# Patient Record
Sex: Male | Born: 1945 | Race: White | Hispanic: No | Marital: Single | Smoking: Former smoker
Health system: Southern US, Community
[De-identification: ages and names within clinical notes are randomized; demographics above are authoritative.]

## PROBLEM LIST (undated history)

## (undated) DIAGNOSIS — I714 Abdominal aortic aneurysm, without rupture, unspecified: Secondary | ICD-10-CM

## (undated) DIAGNOSIS — I2510 Atherosclerotic heart disease of native coronary artery without angina pectoris: Secondary | ICD-10-CM

## (undated) DIAGNOSIS — I739 Peripheral vascular disease, unspecified: Secondary | ICD-10-CM

## (undated) DIAGNOSIS — I779 Disorder of arteries and arterioles, unspecified: Secondary | ICD-10-CM

## (undated) DIAGNOSIS — I10 Essential (primary) hypertension: Secondary | ICD-10-CM

## (undated) HISTORY — DX: Disorder of arteries and arterioles, unspecified: I77.9

## (undated) HISTORY — PX: OTHER SURGICAL HISTORY: SHX169

## (undated) HISTORY — DX: Peripheral vascular disease, unspecified: I73.9

## (undated) HISTORY — PX: CAROTID ENDARTERECTOMY: SUR193

## (undated) HISTORY — PX: CORONARY ANGIOPLASTY: SHX604

---

## 1999-10-26 ENCOUNTER — Ambulatory Visit: Admission: RE | Admit: 1999-10-26 | Discharge: 1999-10-26 | Payer: Self-pay | Admitting: *Deleted

## 1999-11-21 ENCOUNTER — Inpatient Hospital Stay: Admission: RE | Admit: 1999-11-21 | Discharge: 1999-11-23 | Payer: Self-pay | Admitting: *Deleted

## 1999-11-22 ENCOUNTER — Encounter: Payer: Self-pay | Admitting: *Deleted

## 2000-05-31 ENCOUNTER — Ambulatory Visit (HOSPITAL_COMMUNITY): Admission: RE | Admit: 2000-05-31 | Discharge: 2000-05-31 | Payer: Self-pay | Admitting: *Deleted

## 2000-06-03 ENCOUNTER — Ambulatory Visit (HOSPITAL_COMMUNITY): Admission: RE | Admit: 2000-06-03 | Discharge: 2000-06-03 | Payer: Self-pay | Admitting: *Deleted

## 2000-06-03 ENCOUNTER — Encounter: Payer: Self-pay | Admitting: *Deleted

## 2000-12-24 ENCOUNTER — Encounter: Payer: Self-pay | Admitting: *Deleted

## 2000-12-26 ENCOUNTER — Inpatient Hospital Stay (HOSPITAL_COMMUNITY): Admission: RE | Admit: 2000-12-26 | Discharge: 2000-12-28 | Payer: Self-pay | Admitting: *Deleted

## 2007-07-29 ENCOUNTER — Ambulatory Visit: Payer: Self-pay | Admitting: *Deleted

## 2011-03-09 NOTE — Op Note (Signed)
. [facility]  Patient:    Nathan Mcgee, Nathan Mcgee                        MRN: [id number] Proc. Date: 12/26/00 Adm. Date:  [id number] Disc. Date: [id number] Attending:  Melvenia Needles CC:         Darden Palmer., M.D.  Dr. Beckie Busing, [place]   Operative Report  PREOPERATIVE DIAGNOSIS:  Severe right internal carotid artery stenosis.  POSTOPERATIVE DIAGNOSIS:  Severe right internal carotid artery stenosis.  OPERATION PERFORMED:  Right carotid endarterectomy.  SURGEON:  Denman George, M.D.  ASSISTANT: 1. Di Kindle. Edilia Bo, M.D. 2. Tollie Pizza. Thomasena Edis, P.A.  ANESTHESIA:  General endotracheal.  ANESTHESIOLOGIST:  Dr. Krista Blue.  INDICATIONS FOR PROCEDURE:  Mr. Kamel is a 65 year old male with known cerebrovascular occlusive disease.  He has a left internal carotid artery occlusion.  He is known to have right coronary artery stenosis.  He has been followed in the office on a regular basis with serial duplex evaluation. Recent duplexes reveal further progression of right internal carotid artery stenosis.  Taking into account his contralateral occlusion, it was recommended that the patient undergo right carotid endarterectomy for reduction of stroke risk.  He consented for surgery.  Risks and benefits of the operative procedure were explained in detail including potential risk of MI, CVA, and death.  Major morbidity and mortality approximately 2%.  The patient consented for surgery.  DESCRIPTION OF PROCEDURE:  Patient placed in supine position.   General endotracheal anesthesia induced.  Foley catheter and arterial line inserted.  The right neck was prepped and draped in a sterile fashion.  A curvilinear skin incision was made along the anterior border of the right sternomastoid muscle.  The incision extended deeply through the subcutaneous tissues. Dissection carried down to the platysma with electrocautery.  Deep dissection carried  along the anterior border of the sternomastoid to the carotid sheath. The common carotid artery mobilized proximally and encircled with a vessel loop.  Distal dissection carried up to the carotid bifurcation where the superior thyroid and external carotid artery were encircled with fine vessel loops.  The internal carotid artery followed distally up to the posterior belly of the digastric muscle.  The distal internal carotid artery was free of significant disease and encircled with a vessel loop.  The hypoglossal nerve clearly identified and reflected superiorly and preserved.  The carotid bifurcation revealed calcified plaque extending into the internal carotid artery.  The patient was administered 7000 units of heparin intravenously.  Adequate circulation time permitted.  The carotid vessels controlled with clamps.  A longitudinal arteriotomy made in the distal common carotid artery.  The arteriotomy extended across the carotid bulb and up into the internal carotid artery.  There was approximately an 80% right internal carotid artery stenosis present.  A shunt was inserted.  The plaque was removed with an endarterectomy elevator.  In the common carotid artery the plaque was divided transversely with Potts scissors.  The superior thyroid and external carotid were endarterectomized using an eversion technique.  Under direct vision the internal carotid artery plaque was feathered out without distal flap.  Fragments of plaque were removed with plaque forceps.  The site irrigated with heparin saline solution.  A Hemashield Finesse patch was then placed over the endartectomy site with running 6-0 Prolene suture.  The shunt removed.  All vessels well-flushed. Clamps were removed directing initial antegrade flow up the external carotid  artery, following this, the internal carotid artery was released.  There was an excellent pulse and Doppler signal in the distal internal carotid artery.  The  patient administered 30 mg protamine intravenously.  Adequate hemostasis obtained.  Sponge and instrument counts were correct.  Sternomastoid fascia closed with running 2-0 Vicryl suture.  Platysma closed with running 3-0 Vicryl suture.  The skin was closed with 4-0 Monocryl.  Halkf inch Steri-Strips applied.  The patient tolerated the procedure well. Transferred to recovery room in stable neurologic condition. DD:  01/15/01 TD:  01/16/01 Job: [id number]

## 2011-03-09 NOTE — Discharge Summary (Signed)
[place]. [facility]  Patient:    Nathan Mcgee, Nathan Mcgee                          MRN: [id number] Adm. Date:  12/26/00 Attending:  Denman George, M.D. Dictator:   Marlowe Kays, P.A. CC:         Dr. Orlan Leavens., M.D.   Discharge Summary  DATE OF BIRTH:  Oct 11, 1946  CHIEF COMPLAINT:  Coronary artery disease.  HISTORY OF PRESENT ILLNESS:  Nathan Mcgee is a pleasant 65 year old white male with known history of coronary artery disease.  This condition has been followed up with ultrasound.  The patient underwent an arteriogram in August 2001 revealing right ICA stenosis with about 60% range, left ICA occlusion, and right vertebral artery occlusion, asymptomatic.  Ultrasound on December 09, 2000, however, revealed continuous progression of the right ICA stenosis, now in the severe range with velocities over 500 cm per second.  The patient remains asymptomatic.  Because there is concern of a stroke at this young age, Dr. Madilyn Fireman recommended proceeding with right carotid endarterectomy scheduled on December 26, 2000.  Other than occasional headaches which are chronic, the patient denies any nausea, vomiting, vertigo, dizziness, falls, seizures, numbness, tingling, muscle weakness, speech impairment, dysphagia other than his occasional reflux symptoms.  No vision changes, syncope, presyncope, memory loss, or confusion.  PAST MEDICAL HISTORY:  1. Coronary artery disease.  2. Anxiety.  3. Hypertension.  4. Hypercholesterolemia.  5. GERD symptoms.  6. Renal insufficiency.  7. History of tobacco abuse, recently quit.  8. History of nephro3lithiasis.  9. History of migraine headaches. 10. Peripheral vascular occlusive disease, claudication. 11. Status post left ankle fracture in [place] during the war.  PAST SURGICAL HISTORY:  1. Status post cerebral angiogram in 2001, Dr. Madilyn Fireman.  2. Status post left external iliac artery PTA and stent on     November 22, 1999, Dr. Madilyn Fireman.  3. Status post lithotripsy in 1998.  4. Status post renal angioplasty in 1972 and 1978.  MEDICATIONS:  1. Elavil 25 mg p.o. q.d.  2. Atenolol 50 mg b.i.d.  3. Zocor 20 mg q.d.  4. Triamterene 75 mg 1/2 p.o. q.d.  5. Aciphex 20 mg q.d.  6. Niacin 500 mg b.i.d.  ALLERGIES:  No known drug allergies.  REVIEW OF SYSTEMS:  See HPI and Past Medical History for significant positives.   No diabetes, asthma, or COPD.  He does have seasonal allergies.  FAMILY HISTORY:  Mother died at 9 of breast cancer.  Father alive with history of diabetes.  SOCIAL HISTORY:  Married, one grown child.  He is an Public affairs consultant.  He is a [place] veteran.  He quit in November 2000 the use of cigarettes.  He denies any alcohol intake.  PHYSICAL EXAMINATION:  GENERAL:  A 65 year old white male in no acute distress, alert and oriented x 3.  VITAL SIGNS:  Blood pressure 100/62, pulse 60, respirations 18.  HEENT:  Normocephalic, atraumatic.  PERRLA. EOMI.  Funduscopic exam within normal limits .  NECK:  Supple.  No JVD.  Bilateral bruits more pronounced on the right.  No lymphadenopathy.  CHEST:  Symmetrical on inspiration.  Lungs show anterior wheezing.  Distant lung sounds, no rhonchi or rales.  No lymphadenopathy.  CARDIOVASCULAR:  Regular rate and rhythm, 2/6 systolic murmur.  No rubs or gallops.  ABDOMEN:  Soft, nontender.  Bowel sounds present x 4.  No hepatosplenomegaly or bruits.  GU/RECTAL:  Deferred.  EXTREMITIES:  No clubbing, cyanosis, or edema.  SKIN:  No ulcerations.  Decreased temperature in the feet; otherwise, the rest of the temperature of his skin is warm.  PERIPHERAL PULSES:  Carotid 2+ bilaterally.  Femoral 2+ bilaterally with bilateral bruits.  Popliteal 1+ on the right, nonpalpable on the left. Dorsalis pedis and posterior tibialis 1+ on the right, nonpalpable on the left.  NEUROLOGIC:  Nonfocal.  Gait steady.  DTRs 2+ bilaterally.  Muscle  strength 5/5.  ASSESSMENT/PLAN:  Right internal carotid artery stenosis for right carotid endarterectomy on December 26, 2000, by Dr. Madilyn Fireman.  Dr. Madilyn Fireman has seen and evaluated this patient prior to admission and has explained the risks and benefits involving the procedure, and the patient has agreed to continue. DD:  12/24/00 TD:  12/24/00 Job: [id number]

## 2011-03-09 NOTE — Discharge Summary (Signed)
[place]  Patient:    Nathan Mcgee, PINSON                        MRN: [id number] Adm. Date:  [id number] Disc. Date: [id number] Attending:  Melvenia Needles Dictator:   Tollie Pizza. Thomasena Edis, P.A. CC:         Dr. Beckie Busing, [place], [place]   Discharge Summary  DISCHARGE DIAGNOSES: 1. Progressive asymptomatic right internal carotid artery stenosis. 2. Occluded left internal carotid artery. 3. Hypertension. 4. Hypercholesterolemia. 5. Gastroesophageal reflux disease. 6. Peripheral vascular disease. 7. Anxiety disorder.  PROCEDURE:  Right carotid endarterectomy with Dacron patch angioplasty.  HISTORY OF PRESENT ILLNESS:  The patient is a 65 year old, white male with a known history of carotid artery disease.  On recent ultrasound, he was noted to have significant progression of the stenosis of his right internal carotid into the severe range.  His left internal carotids are occluded.  It was felt that in light of this progression and his other coexisting medical problems, he would benefit from surgical intervention at this time.  HOSPITAL COURSE:  He was admitted on December 26, 2000, and taken to the operating room where he underwent right carotid endarterectomy with Dacron patch angioplasty.  He tolerated the procedure well and was transferred from the PACU to the floor in stable condition.  He did become hypotensive and had difficulty maintaining blood pressures above 100 systolic despite adequate volume status.  He was placed on dopamine drip which was later to be weaned when his blood pressure stabilized.  At this time, he is maintaining blood pressures in the 130-160 systolic range.  He is off all drips at this time. He has been ambulating in the halls without difficulty and is weaned off of supplemental oxygen.  He is tolerating a regular diet with normal bowel and bladder function.  His neck incision is healing well.  Neurologically, he is grossly  intact except for slight tongue deviation to the right which is felt to be related to hypoglossal dysfunction.  This is stable and has not worsened.  It is felt that he can be discharged home at this time.  SPECIAL INSTRUCTIONS:  He is to shower daily and clean his incisions with soap and water.  Our office is to be notified if his incisions reddened, develop drainage, swelling or if he experiences any fever greater than 101 or new-onset neurological symptoms.  ACTIVITY;  He is to refrain from driving, strenuous exercise or lifting greater than 10 pounds, but is to continue daily walking and deep breathing exercises.  DISCHARGE MEDICATIONS: 1. Continue home medications. 2. Prescription given for Tylox one to two q.4h. p.r.n. pain.  FOLLOWUP:  He was to see Dr. Madilyn Fireman on January 13, 2001, at 8:40 a.m. for followup.  We will schedule an appointment with his primary care physician, Dr. Lucretia Roers, in two to three weeks. DD:  12/28/00 TD:  12/30/00 Job: [id number]

## 2012-01-14 ENCOUNTER — Emergency Department (HOSPITAL_COMMUNITY): Payer: Medicare Other

## 2012-01-14 ENCOUNTER — Other Ambulatory Visit: Payer: Self-pay

## 2012-01-14 ENCOUNTER — Inpatient Hospital Stay (HOSPITAL_COMMUNITY)
Admission: EM | Admit: 2012-01-14 | Discharge: 2012-01-27 | DRG: 246 | Disposition: A | Discharge status: Home / Self Care | Payer: Medicare Other | Attending: Cardiovascular Disease | Admitting: Cardiovascular Disease

## 2012-01-14 ENCOUNTER — Encounter (HOSPITAL_COMMUNITY): Payer: Self-pay

## 2012-01-14 DIAGNOSIS — F411 Generalized anxiety disorder: Secondary | ICD-10-CM | POA: Diagnosis present

## 2012-01-14 DIAGNOSIS — N289 Disorder of kidney and ureter, unspecified: Secondary | ICD-10-CM | POA: Diagnosis present

## 2012-01-14 DIAGNOSIS — I469 Cardiac arrest, cause unspecified: Secondary | ICD-10-CM | POA: Diagnosis not present

## 2012-01-14 DIAGNOSIS — J9601 Acute respiratory failure with hypoxia: Secondary | ICD-10-CM

## 2012-01-14 DIAGNOSIS — R570 Cardiogenic shock: Secondary | ICD-10-CM | POA: Diagnosis present

## 2012-01-14 DIAGNOSIS — R079 Chest pain, unspecified: Secondary | ICD-10-CM

## 2012-01-14 DIAGNOSIS — I2109 ST elevation (STEMI) myocardial infarction involving other coronary artery of anterior wall: Principal | ICD-10-CM | POA: Diagnosis present

## 2012-01-14 DIAGNOSIS — R001 Bradycardia, unspecified: Secondary | ICD-10-CM | POA: Diagnosis not present

## 2012-01-14 DIAGNOSIS — J9600 Acute respiratory failure, unspecified whether with hypoxia or hypercapnia: Secondary | ICD-10-CM | POA: Diagnosis present

## 2012-01-14 DIAGNOSIS — G4700 Insomnia, unspecified: Secondary | ICD-10-CM | POA: Diagnosis present

## 2012-01-14 DIAGNOSIS — D72829 Elevated white blood cell count, unspecified: Secondary | ICD-10-CM | POA: Diagnosis present

## 2012-01-14 DIAGNOSIS — I10 Essential (primary) hypertension: Secondary | ICD-10-CM | POA: Diagnosis present

## 2012-01-14 DIAGNOSIS — IMO0002 Reserved for concepts with insufficient information to code with codable children: Secondary | ICD-10-CM | POA: Diagnosis present

## 2012-01-14 DIAGNOSIS — T82897A Other specified complication of cardiac prosthetic devices, implants and grafts, initial encounter: Secondary | ICD-10-CM | POA: Diagnosis not present

## 2012-01-14 DIAGNOSIS — Y849 Medical procedure, unspecified as the cause of abnormal reaction of the patient, or of later complication, without mention of misadventure at the time of the procedure: Secondary | ICD-10-CM | POA: Diagnosis not present

## 2012-01-14 DIAGNOSIS — I219 Acute myocardial infarction, unspecified: Secondary | ICD-10-CM

## 2012-01-14 DIAGNOSIS — J810 Acute pulmonary edema: Secondary | ICD-10-CM | POA: Diagnosis present

## 2012-01-14 DIAGNOSIS — E876 Hypokalemia: Secondary | ICD-10-CM | POA: Diagnosis not present

## 2012-01-14 DIAGNOSIS — T380X5A Adverse effect of glucocorticoids and synthetic analogues, initial encounter: Secondary | ICD-10-CM | POA: Diagnosis present

## 2012-01-14 DIAGNOSIS — I498 Other specified cardiac arrhythmias: Secondary | ICD-10-CM | POA: Diagnosis present

## 2012-01-14 DIAGNOSIS — F17200 Nicotine dependence, unspecified, uncomplicated: Secondary | ICD-10-CM | POA: Diagnosis present

## 2012-01-14 DIAGNOSIS — R51 Headache: Secondary | ICD-10-CM | POA: Diagnosis present

## 2012-01-14 HISTORY — DX: Essential (primary) hypertension: I10

## 2012-01-14 HISTORY — DX: Abdominal aortic aneurysm, without rupture, unspecified: I71.40

## 2012-01-14 HISTORY — DX: Abdominal aortic aneurysm, without rupture: I71.4

## 2012-01-14 HISTORY — DX: Atherosclerotic heart disease of native coronary artery without angina pectoris: I25.10

## 2012-01-14 LAB — PROTIME-INR
INR: 0.97 (ref 0.00–1.49)
Prothrombin Time: 13.1 seconds (ref 11.6–15.2)

## 2012-01-14 LAB — BASIC METABOLIC PANEL
GFR calc Af Amer: 62 mL/min — ABNORMAL LOW (ref >90)
GFR calc non Af Amer: 54 mL/min — ABNORMAL LOW (ref >90)
Glucose, Bld: 118 mg/dL — ABNORMAL HIGH (ref 70–99)
Potassium: 4.1 mEq/L (ref 3.5–5.1)
Sodium: 136 mEq/L (ref 135–145)

## 2012-01-14 LAB — CBC
MCH: 30.7 pg (ref 26.0–34.0)
Platelets: 242 10*3/uL (ref 150–400)
RBC: 4.72 MIL/uL (ref 4.22–5.81)
WBC: 10.6 10*3/uL — ABNORMAL HIGH (ref 4.0–10.5)

## 2012-01-14 LAB — DIFFERENTIAL
Eosinophils Absolute: 0.4 10*3/uL (ref 0.0–0.7)
Lymphs Abs: 1.5 10*3/uL (ref 0.7–4.0)
Neutrophils Relative %: 77 % (ref 43–77)

## 2012-01-14 LAB — CARDIAC PANEL(CRET KIN+CKTOT+MB+TROPI)
Relative Index: 2.8 — ABNORMAL HIGH (ref 0.0–2.5)
Troponin I: <0.3 ng/mL (ref <0.30)

## 2012-01-14 MED ORDER — SODIUM CHLORIDE 0.9 % IJ SOLN: 3.0000 mL | INTRAMUSCULAR | Status: DC | PRN

## 2012-01-14 MED ORDER — SODIUM CHLORIDE 0.9 % IV BOLUS (SEPSIS)
1000.0000 mL | Freq: Once | INTRAVENOUS | Status: AC
  Administered 2012-01-14: 1000 mL via INTRAVENOUS

## 2012-01-14 MED ORDER — MUPIROCIN 2 % EX OINT
1.0000 "application " | TOPICAL_OINTMENT | Freq: Two times a day (BID) | CUTANEOUS | Status: AC
  Administered 2012-01-15 – 2012-01-19 (×10): 1 via NASAL
  Filled 2012-01-14 (×2): qty 22

## 2012-01-14 MED ORDER — HEPARIN (PORCINE) IN NACL 100-0.45 UNIT/ML-% IJ SOLN
1400.0000 [IU]/h | INTRAMUSCULAR | Status: DC
  Administered 2012-01-14: 1100 [IU]/h via INTRAVENOUS
  Filled 2012-01-14 (×3): qty 250

## 2012-01-14 MED ORDER — MORPHINE SULFATE 2 MG/ML IJ SOLN
2.0000 mg | INTRAMUSCULAR | Status: DC | PRN
  Administered 2012-01-14 – 2012-01-15 (×2): 2 mg via INTRAVENOUS
  Filled 2012-01-14 (×3): qty 1

## 2012-01-14 MED ORDER — AMITRIPTYLINE HCL 25 MG PO TABS
25.0000 mg | ORAL_TABLET | Freq: Every day | ORAL | Status: DC
  Administered 2012-01-14 – 2012-01-17 (×4): 25 mg via ORAL
  Filled 2012-01-14 (×6): qty 1

## 2012-01-14 MED ORDER — BENAZEPRIL HCL 10 MG PO TABS
10.0000 mg | ORAL_TABLET | Freq: Every day | ORAL | Status: DC
  Filled 2012-01-14: qty 1

## 2012-01-14 MED ORDER — SODIUM CHLORIDE 0.9 % IV SOLN: 250.0000 mL | INTRAVENOUS | Status: DC | PRN

## 2012-01-14 MED ORDER — SODIUM CHLORIDE 0.9 % IV BOLUS (SEPSIS): 1000.0000 mL | Freq: Once | INTRAVENOUS | Status: DC

## 2012-01-14 MED ORDER — MORPHINE SULFATE 2 MG/ML IJ SOLN: 2.0000 mg | Freq: Once | INTRAMUSCULAR | Status: DC

## 2012-01-14 MED ORDER — NITROGLYCERIN 0.4 MG SL SUBL
0.4000 mg | SUBLINGUAL_TABLET | SUBLINGUAL | Status: DC | PRN
  Administered 2012-01-14: 0.4 mg via SUBLINGUAL
  Filled 2012-01-14: qty 25

## 2012-01-14 MED ORDER — ASPIRIN EC 325 MG PO TBEC
325.0000 mg | DELAYED_RELEASE_TABLET | Freq: Every day | ORAL | Status: DC
  Filled 2012-01-14: qty 1

## 2012-01-14 MED ORDER — PAROXETINE HCL 20 MG PO TABS
20.0000 mg | ORAL_TABLET | Freq: Every day | ORAL | Status: DC
  Administered 2012-01-14 – 2012-01-17 (×4): 20 mg via ORAL
  Filled 2012-01-14 (×5): qty 1

## 2012-01-14 MED ORDER — SODIUM CHLORIDE 0.9 % IJ SOLN: 3.0000 mL | Freq: Two times a day (BID) | INTRAMUSCULAR | Status: DC

## 2012-01-14 MED ORDER — ATENOLOL 25 MG PO TABS
25.0000 mg | ORAL_TABLET | Freq: Every day | ORAL | Status: DC
Start: 2012-01-14 — End: 2012-01-14
  Filled 2012-01-14: qty 1

## 2012-01-14 MED ORDER — ASPIRIN EC 325 MG PO TBEC: 325.0000 mg | DELAYED_RELEASE_TABLET | Freq: Every day | ORAL | Status: DC

## 2012-01-14 MED ORDER — ATROPINE SULFATE 0.1 MG/ML IJ SOLN
INTRAMUSCULAR | Status: AC
  Administered 2012-01-14: 18:00:00
  Filled 2012-01-14: qty 10

## 2012-01-14 MED ORDER — SIMVASTATIN 40 MG PO TABS
40.0000 mg | ORAL_TABLET | Freq: Every day | ORAL | Status: DC
  Filled 2012-01-14 (×2): qty 1

## 2012-01-14 MED ORDER — HEPARIN BOLUS VIA INFUSION
4000.0000 [IU] | Freq: Once | INTRAVENOUS | Status: AC
  Administered 2012-01-14: 4000 [IU] via INTRAVENOUS

## 2012-01-14 MED ORDER — SODIUM CHLORIDE 0.9 % IV SOLN
INTRAVENOUS | Status: DC
  Administered 2012-01-14: 75 mL/h via INTRAVENOUS
  Administered 2012-01-15: 11:00:00 via INTRAVENOUS

## 2012-01-14 MED ORDER — ATENOLOL 25 MG PO TABS
25.0000 mg | ORAL_TABLET | Freq: Every day | ORAL | Status: DC
  Administered 2012-01-15: 25 mg via ORAL
  Filled 2012-01-14: qty 1

## 2012-01-14 MED ORDER — CHLORHEXIDINE GLUCONATE CLOTH 2 % EX PADS
6.0000 | MEDICATED_PAD | Freq: Every day | CUTANEOUS | Status: DC
  Administered 2012-01-15: 6 via TOPICAL

## 2012-01-14 MED ORDER — ALUM & MAG HYDROXIDE-SIMETH 200-200-20 MG/5ML PO SUSP
30.0000 mL | Freq: Four times a day (QID) | ORAL | Status: DC | PRN
  Administered 2012-01-15: 30 mL via ORAL
  Filled 2012-01-14: qty 30

## 2012-01-14 MED ORDER — BENAZEPRIL HCL 10 MG PO TABS
10.0000 mg | ORAL_TABLET | Freq: Every day | ORAL | Status: DC
  Administered 2012-01-15: 10 mg via ORAL
  Filled 2012-01-14: qty 1

## 2012-01-14 MED ORDER — SODIUM CHLORIDE 0.9 % IJ SOLN
3.0000 mL | Freq: Two times a day (BID) | INTRAMUSCULAR | Status: DC
  Administered 2012-01-14: 3 mL via INTRAVENOUS

## 2012-01-14 MED ORDER — MORPHINE SULFATE 4 MG/ML IJ SOLN
4.0000 mg | Freq: Once | INTRAMUSCULAR | Status: DC
  Administered 2012-01-14: 2 mg via INTRAVENOUS
  Filled 2012-01-14: qty 1

## 2012-01-14 MED ORDER — LEVOTHYROXINE SODIUM 137 MCG PO TABS
137.0000 ug | ORAL_TABLET | Freq: Every day | ORAL | Status: DC
  Administered 2012-01-15 – 2012-01-17 (×3): 137 ug via ORAL
  Filled 2012-01-14 (×5): qty 1

## 2012-01-14 MED ORDER — ASPIRIN 81 MG PO CHEW
324.0000 mg | CHEWABLE_TABLET | ORAL | Status: AC
  Administered 2012-01-15: 324 mg via ORAL
  Filled 2012-01-14: qty 4

## 2012-01-14 NOTE — ED Notes (Signed)
Pt was brought in by EMS with c/o chest pressure onset 45 minutes ago with SOB. Pt took 325 mg po of ASA and was given NTG SL x2 with minimal relief.

## 2012-01-14 NOTE — ED Notes (Signed)
Pt claimed that the chest pain is better but pressure remains and pain to back and lt arm is worse after 1 tablet of NTG SL. Will let Dr. Criss Alvine know.

## 2012-01-14 NOTE — ED Notes (Signed)
Pt and family noted to be anxious. Dr. Criss Alvine made aware.

## 2012-01-14 NOTE — ED Notes (Signed)
Pt claimed that his chest pain is better, but the pressure remains and pain to the back and lt arm is worse after the NTG SL. Dr. Criss Alvine is made aware

## 2012-01-14 NOTE — H&P (Signed)
Physician History and Physical  Patient ID: Nathan Mcgee MRN: [id number] DOB/AGE: Jul 09, 1946 66 y.o. Admit date: 01/14/2012  Primary Care Physician: No primary provider on file. Primary Cardiologist: [facility]  Active Problems:  * No active hospital problems. *    HPI:  66 yo vasculopath.  History of RCEA by Dr Madilyn Fireman Subsequent stent to RICA/  Indicates left side occluded.  Also has had AAA repair with bifem.  Denies previous CAD.  This afternoon at rest watching t.v. arouund 3:30 had  SSCP.  Has chronic back pain but this was different.  EMS gave asa and 2 nitro.  Not much change.  In ER hypotensive with nitro.  Responded to saline.  No pleuritic component and initial troponin negative.  Pain improving slowily In ER better on left side.  Quck look echo shows LVH no definate RWMA and normal ascneding aorta with no disection or pericardial effusion.  CXR with cardiomegaly no CHF and normal mediastinum  Discussed with family Already on Plavix for vascular disease.  Heparin and continue beta blocker will need cath in am unless he developes acute ECG changes or positive enzymes Also indicates he cannot take fentanyl patch  Review of systems complete and found to be negative unless listed above   Past Medical History  Diagnosis Date  . Coronary artery disease   . Hypertension     No family history on file.  History   Social History  . Marital Status: Single    Spouse Name: N/A    Number of Children: N/A  . Years of Education: N/A   Occupational History  . Not on file.   Social History Main Topics  . Smoking status: Current Everyday Smoker -- 0.5 packs/day  . Smokeless tobacco: Not on file  . Alcohol Use: No  . Drug Use: No  . Sexually Active:    Other Topics Concern  . Not on file   Social History Narrative  . No narrative on file    Past Surgical History  Procedure Date  . Coronary angioplasty   . Carotid endarterectomy       (Not in a hospital  admission)  Physical Exam: Mild distress Hemodynamics good Lungs clear Right carotid bruit S1/S2 SEM  BS positive no AAA  Left femoral brut S/P aortobifem Plus 2 PT Neuro nonfocal  Labs:   Lab Results  Component Value Date   WBC 10.6* 01/14/2012   HGB 14.5 01/14/2012   HCT 41.1 01/14/2012   MCV 87.1 01/14/2012   PLT 242 01/14/2012    Lab 01/14/12 1747  NA 136  K 4.1  CL 100  CO2 26  BUN 21  CREATININE 1.34  CALCIUM 9.2  PROT --  BILITOT --  ALKPHOS --  ALT --  AST --  GLUCOSE 118*   Lab Results  Component Value Date   TROPONINI <0.30 01/14/2012       Radiology: Looked at digital image on machine when portable done.  Cardiomegaly mediastinum ok no CHF  EKG: SR LVH T inversion V2-4 and I,AVL ? Ischemia or strain from LVH no old ones for comparision  ASSESSMENT AND PLAN: Chest Pain:  Over 5 hours of pain with negative troponin.  Quick beside echo with no effusion.  Normal ascending Aorta and no obvious RWMA although apex not well seen Continue ASA/Plavix start heparin Admit to ICU Cath in am PVD:  Palpable pulses in legs.  Denies claudication.  Activity limited by back pain HTN:  Continue atenolol  and ACE hold calcium blocker Chol:  Continue statin with known PVD  Discussed plan of care with patient wife and family They are in agreement and understand.  SignedTheron Arista Nishan3/25/2013, 7:00 PM

## 2012-01-14 NOTE — Progress Notes (Signed)
ANTICOAGULATION CONSULT NOTE - Initial Consult  Pharmacy Consult for Heparin Indication: chest pain/ACS  Allergies  Allergen Reactions  . Codeine Itching    Patient Measurements: Height: 5\' 8"  (172.7 cm) Weight: 175 lb (79.379 kg) IBW/kg (Calculated) : 68.4   Vital Signs: Temp: 98 F (36.7 C) (03/25 1706) Temp src: Oral (03/25 1706) BP: 144/75 mmHg (03/25 1850) Pulse Rate: 78  (03/25 1850)  Labs:  Basename 01/14/12 1747  HGB 14.5  HCT 41.1  PLT 242  APTT --  LABPROT --  INR --  HEPARINUNFRC --  CREATININE 1.34  CKTOTAL --  CKMB --  TROPONINI <0.30   Estimated Creatinine Clearance: 52.5 ml/min (by C-G formula based on Cr of 1.34).  Medical History: Past Medical History  Diagnosis Date  . Coronary artery disease   . Hypertension     Medications:   (Not in a hospital admission)  Assessment: Chest Pain:  In a patient with known history of CAD and PVD.  To begin anticoagulation with Heparin.  No contraindications identified.  Goal of Therapy:  Heparin level 0.3-0.7 units/ml   Plan:  Heparin 4000 units IV bolus x 1 Start Heparin infusion at 1100 units/hr (~14 units/kg/hr) Check Heparin level and CBC 6 hours after starting Heparin Daily Heparin level and CBC  Estella Husk, Pharm.D., BCPS Clinical Pharmacist  Pager [phone] 01/14/2012, 7:21 PM

## 2012-01-14 NOTE — ED Notes (Signed)
Pt was received to RM 19 with c/o chest pressure onset 45 minutes ago with SOB. Pt is attached to cardiac monitor. O2 started at 2 LPM/[place]. Family is at the bedside

## 2012-01-14 NOTE — ED Provider Notes (Signed)
History     CSN: [id number]  Arrival date & time 01/14/12  1655   First MD Initiated Contact with Patient 01/14/12 1731      Chief Complaint  Patient presents with  . Chest Pain    (Consider location/radiation/quality/duration/timing/severity/associated sxs/prior treatment) Patient is a 66 y.o. male presenting with chest pain. The history is provided by the patient and the spouse.  Chest Pain The chest pain began less than 1 hour ago. Chest pain occurs constantly. The chest pain is unchanged. Associated with: nothing. At its most intense, the pain is at 8/10. The pain is currently at 4/10. The severity of the pain is moderate. The quality of the pain is described as pressure-like. Primary symptoms include shortness of breath. Pertinent negatives for primary symptoms include no fever, no cough, no abdominal pain, no nausea and no vomiting.  Pertinent negatives for associated symptoms include no diaphoresis and no numbness.     Past Medical History  Diagnosis Date  . Coronary artery disease   . Hypertension     Past Surgical History  Procedure Date  . Coronary angioplasty   . Carotid endarterectomy     No family history on file.  History  Substance Use Topics  . Smoking status: Current Everyday Smoker -- 0.5 packs/day  . Smokeless tobacco: Not on file  . Alcohol Use: No      Review of Systems  Constitutional: Negative for fever, chills and diaphoresis.  HENT: Negative for congestion and rhinorrhea.   Respiratory: Positive for shortness of breath. Negative for cough.   Cardiovascular: Positive for chest pain. Negative for leg swelling.  Gastrointestinal: Negative for nausea, vomiting, abdominal pain, constipation and blood in stool.  Genitourinary: Negative for dysuria and decreased urine volume.  Neurological: Negative for numbness and headaches.  Psychiatric/Behavioral: Negative for confusion.  All other systems reviewed and are negative.    Allergies    Codeine  Home Medications   Current Outpatient Rx  Name Route Sig Dispense Refill  . ALLOPURINOL 300 MG PO TABS Oral Take 300 mg by mouth daily.    Marland Kitchen AMITRIPTYLINE HCL 25 MG PO TABS Oral Take 25 mg by mouth at bedtime.    Marland Kitchen AMLODIPINE BESYLATE 5 MG PO TABS Oral Take 5 mg by mouth daily.    . ASPIRIN 325 MG PO TABS Oral Take 325 mg by mouth daily.    . ATENOLOL 50 MG PO TABS Oral Take 50 mg by mouth daily.    Marland Kitchen BENAZEPRIL HCL 20 MG PO TABS Oral Take 20 mg by mouth daily.    Marland Kitchen CLOPIDOGREL BISULFATE 75 MG PO TABS Oral Take 75 mg by mouth daily.    . OMEGA-3 FATTY ACIDS 1000 MG PO CAPS Oral Take 2 g by mouth daily.    Marland Kitchen HYDROCODONE-ACETAMINOPHEN 10-650 MG PO TABS Oral Take 1 tablet by mouth every 6 (six) hours as needed. For pain.    Marland Kitchen LEVOTHYROXINE SODIUM 137 MCG PO TABS Oral Take 137 mcg by mouth daily.    . METHYLCELLULOSE (LAXATIVE) 500 MG PO TABS Oral Take 1,000 mg by mouth daily.    Marland Kitchen PANTOPRAZOLE SODIUM 40 MG PO TBEC Oral Take 40 mg by mouth 2 (two) times daily.    Marland Kitchen PAROXETINE HCL 20 MG PO TABS Oral Take 20 mg by mouth daily.    Marland Kitchen SIMVASTATIN 40 MG PO TABS Oral Take 40 mg by mouth every evening.      BP 143/77  Pulse 56  Temp(Src) 98 F (  36.7 C) (Oral)  Resp 17  Ht 5\' 8"  (1.727 m)  Wt 175 lb (79.379 kg)  BMI 26.61 kg/m2  SpO2 100%  Physical Exam  Nursing note and vitals reviewed. Constitutional: He is oriented to person, place, and time. He appears well-developed and well-nourished.  HENT:  Head: Normocephalic and atraumatic.  Right Ear: External ear normal.  Left Ear: External ear normal.  Nose: Nose normal.  Neck: Neck supple.  Cardiovascular: Normal rate, regular rhythm, normal heart sounds and intact distal pulses.   No murmur heard. Pulmonary/Chest: Effort normal. He has rales in the right lower field and the left lower field. He exhibits no tenderness.  Abdominal: Soft. He exhibits no distension and no mass. There is no tenderness. There is no rebound and no  guarding.  Musculoskeletal: He exhibits no edema.  Lymphadenopathy:    He has no cervical adenopathy.  Neurological: He is alert and oriented to person, place, and time.  Skin: Skin is warm and dry.    ED Course  Procedures (including critical care time)  Labs Reviewed  CBC - Abnormal; Notable for the following:    WBC 10.6 (*)    All other components within normal limits  DIFFERENTIAL - Abnormal; Notable for the following:    Neutro Abs 8.2 (*)    All other components within normal limits  BASIC METABOLIC PANEL - Abnormal; Notable for the following:    Glucose, Bld 118 (*)    GFR calc non Af Amer 54 (*)    GFR calc Af Amer 62 (*)    All other components within normal limits  TROPONIN I  PROTIME-INR  APTT   Dg Chest Portable 1 View  01/14/2012  *RADIOLOGY REPORT*  Clinical Data: Chest pain.  Hypertension and coronary artery disease.  PORTABLE CHEST - 1 VIEW  Comparison: None  Findings: The heart size is enlarged.  The mediastinal contours are within normal limits.  Both lungs are clear. Scoliosis deformity affects the thoracic spine.  IMPRESSION:  No acute findings.  Original Report Authenticated By: Rosealee Albee, M.D.     Date: 01/14/2012  Rate: 57  Rhythm: sinus bradycardia  QRS Axis: left  Intervals: normal  ST/T Wave abnormalities: T wave inversions I, AVL, V2-V6  Conduction Disutrbances:none  Narrative Interpretation:   Old EKG Reviewed: none available    Date: 01/14/2012  Rate: 40  Rhythm: junctional  QRS Axis: left  Intervals: normal  ST/T Wave abnormalities: ST depressions laterally  Conduction Disutrbances:none  Narrative Interpretation:   Old EKG Reviewed: changes noted - T wave inversions in lateral and anterior leads deeper, now new ST depressions as well   1. Chest pain       MDM  66 yo with left chest pressure x 45 min PTA. + dyspnea. No hx of CAD, but has had multiple stents in legs for vascular issues, as well as carotid stents and CEAs. No  acute distress with mild improvement with EMS nitros (and given ASA PTA as well). After 1 NTG here patient worse bradycardia and decreased BP and in acute distress. Given fluids and atropine with good response for BP and HR. Remained stable in ED, but had worse EKG changes so cardiology consulted quickly. Admitted to ICU for cardiology.        Pricilla Loveless, MD 01/14/12 [phone]

## 2012-01-14 NOTE — ED Notes (Signed)
Primary RN in to assess; ccm showing sinus bradycardia rate of 34; hypotensive; skin pale, clammy, cool; MD in to assess; placed on pacer pads; pt awake, lethargic, family at bedside; additional RNs in to assist

## 2012-01-14 NOTE — ED Notes (Signed)
Cardiology to bedside. 

## 2012-01-14 NOTE — ED Notes (Signed)
Pt transferred to room 4, MD at bedside, VSS, c/o left arm and back pain

## 2012-01-15 ENCOUNTER — Encounter (HOSPITAL_COMMUNITY)
Admission: EM | Disposition: A | Discharge status: Home / Self Care | Payer: Self-pay | Source: Home / Self Care | Attending: Cardiovascular Disease

## 2012-01-15 ENCOUNTER — Inpatient Hospital Stay (HOSPITAL_COMMUNITY): Payer: Medicare Other

## 2012-01-15 ENCOUNTER — Other Ambulatory Visit: Payer: Self-pay

## 2012-01-15 ENCOUNTER — Encounter (HOSPITAL_COMMUNITY): Payer: Self-pay | Admitting: *Deleted

## 2012-01-15 DIAGNOSIS — E872 Acidosis, unspecified: Secondary | ICD-10-CM

## 2012-01-15 DIAGNOSIS — I219 Acute myocardial infarction, unspecified: Secondary | ICD-10-CM | POA: Diagnosis present

## 2012-01-15 DIAGNOSIS — J9601 Acute respiratory failure with hypoxia: Secondary | ICD-10-CM | POA: Diagnosis not present

## 2012-01-15 DIAGNOSIS — J810 Acute pulmonary edema: Secondary | ICD-10-CM

## 2012-01-15 DIAGNOSIS — I2510 Atherosclerotic heart disease of native coronary artery without angina pectoris: Secondary | ICD-10-CM

## 2012-01-15 DIAGNOSIS — R570 Cardiogenic shock: Secondary | ICD-10-CM

## 2012-01-15 DIAGNOSIS — J9600 Acute respiratory failure, unspecified whether with hypoxia or hypercapnia: Secondary | ICD-10-CM

## 2012-01-15 HISTORY — PX: LEFT HEART CATHETERIZATION WITH CORONARY ANGIOGRAM: SHX5451

## 2012-01-15 LAB — POCT I-STAT 3, ART BLOOD GAS (G3+)
Acid-base deficit: 6 mmol/L — ABNORMAL HIGH (ref 0.0–2.0)
Acid-base deficit: 6 mmol/L — ABNORMAL HIGH (ref 0.0–2.0)
Bicarbonate: 20.2 mEq/L (ref 20.0–24.0)
O2 Saturation: 97 %
Patient temperature: 98.6
TCO2: 21 mmol/L (ref 0–100)
pCO2 arterial: 45.1 mmHg — ABNORMAL HIGH (ref 35.0–45.0)
pCO2 arterial: 33.8 mmHg — ABNORMAL LOW (ref 35.0–45.0)
pH, Arterial: 7.339 — ABNORMAL LOW (ref 7.350–7.450)
pO2, Arterial: 78 mmHg — ABNORMAL LOW (ref 80.0–100.0)

## 2012-01-15 LAB — CBC
HCT: 40.9 % (ref 39.0–52.0)
Hemoglobin: 14.2 g/dL (ref 13.0–17.0)
MCHC: 34.7 g/dL (ref 30.0–36.0)
MCV: 86.8 fL (ref 78.0–100.0)
RDW: 13.2 % (ref 11.5–15.5)

## 2012-01-15 LAB — CARDIAC PANEL(CRET KIN+CKTOT+MB+TROPI)
CK, MB: 3.1 ng/mL (ref 0.3–4.0)
Total CK: 78 U/L (ref 7–232)
Troponin I: <0.3 ng/mL (ref <0.30)

## 2012-01-15 LAB — BASIC METABOLIC PANEL
BUN: 16 mg/dL (ref 6–23)
CO2: 25 mEq/L (ref 19–32)
Chloride: 103 mEq/L (ref 96–112)
Glucose, Bld: 126 mg/dL — ABNORMAL HIGH (ref 70–99)
Potassium: 3.7 mEq/L (ref 3.5–5.1)

## 2012-01-15 LAB — POCT ACTIVATED CLOTTING TIME
Activated Clotting Time: 573 seconds
Activated Clotting Time: 490 seconds

## 2012-01-15 LAB — HEPARIN LEVEL (UNFRACTIONATED): Heparin Unfractionated: <0.1 IU/mL — ABNORMAL LOW (ref 0.30–0.70)

## 2012-01-15 SURGERY — LEFT HEART CATHETERIZATION WITH CORONARY ANGIOGRAM
Anesthesia: LOCAL

## 2012-01-15 SURGERY — PERCUTANEOUS CORONARY STENT INTERVENTION (PCI-S)
Anesthesia: LOCAL

## 2012-01-15 MED ORDER — ONDANSETRON HCL 4 MG/2ML IJ SOLN
INTRAMUSCULAR | Status: AC
  Filled 2012-01-15: qty 2

## 2012-01-15 MED ORDER — CLOPIDOGREL BISULFATE 300 MG PO TABS
ORAL_TABLET | ORAL | Status: AC
  Filled 2012-01-15: qty 1

## 2012-01-15 MED ORDER — NALOXONE HCL 0.4 MG/ML IJ SOLN
INTRAMUSCULAR | Status: AC
  Filled 2012-01-15: qty 1

## 2012-01-15 MED ORDER — PANTOPRAZOLE SODIUM 40 MG IV SOLR
40.0000 mg | INTRAVENOUS | Status: DC
  Administered 2012-01-16 (×2): 40 mg via INTRAVENOUS
  Filled 2012-01-15 (×4): qty 40

## 2012-01-15 MED ORDER — MIDAZOLAM HCL 2 MG/2ML IJ SOLN
INTRAMUSCULAR | Status: AC
  Filled 2012-01-15: qty 2

## 2012-01-15 MED ORDER — ACETAMINOPHEN 325 MG PO TABS
650.0000 mg | ORAL_TABLET | ORAL | Status: DC | PRN
  Administered 2012-01-23 – 2012-01-24 (×2): 650 mg via ORAL
  Filled 2012-01-15 (×3): qty 2

## 2012-01-15 MED ORDER — LIDOCAINE HCL (PF) 1 % IJ SOLN
INTRAMUSCULAR | Status: AC
  Filled 2012-01-15: qty 30

## 2012-01-15 MED ORDER — AMIODARONE HCL 150 MG/3ML IV SOLN
INTRAVENOUS | Status: AC
  Filled 2012-01-15: qty 3

## 2012-01-15 MED ORDER — HEPARIN SODIUM (PORCINE) 1000 UNIT/ML IJ SOLN
INTRAMUSCULAR | Status: AC
  Filled 2012-01-15: qty 1

## 2012-01-15 MED ORDER — FENTANYL CITRATE 0.05 MG/ML IJ SOLN
INTRAMUSCULAR | Status: AC
  Administered 2012-01-15: 100 ug
  Filled 2012-01-15: qty 2

## 2012-01-15 MED ORDER — HEPARIN (PORCINE) IN NACL 2-0.9 UNIT/ML-% IJ SOLN
INTRAMUSCULAR | Status: AC
  Filled 2012-01-15: qty 2000

## 2012-01-15 MED ORDER — SODIUM BICARBONATE 8.4 % IV SOLN
INTRAVENOUS | Status: AC
  Filled 2012-01-15: qty 50

## 2012-01-15 MED ORDER — BIVALIRUDIN 250 MG IV SOLR
INTRAVENOUS | Status: AC
  Filled 2012-01-15: qty 250

## 2012-01-15 MED ORDER — HEPARIN BOLUS VIA INFUSION
2000.0000 [IU] | Freq: Once | INTRAVENOUS | Status: AC
  Administered 2012-01-15: 2000 [IU] via INTRAVENOUS
  Filled 2012-01-15: qty 2000

## 2012-01-15 MED ORDER — AMIODARONE HCL IN DEXTROSE 360-4.14 MG/200ML-% IV SOLN
30.0000 mg/h | INTRAVENOUS | Status: DC
  Filled 2012-01-15: qty 200

## 2012-01-15 MED ORDER — SODIUM CHLORIDE 0.9 % IV SOLN
25.0000 ug/h | INTRAVENOUS | Status: DC
  Administered 2012-01-15: 50 ug/h via INTRAVENOUS
  Filled 2012-01-15: qty 50

## 2012-01-15 MED ORDER — HYDROMORPHONE HCL PF 2 MG/ML IJ SOLN
INTRAMUSCULAR | Status: AC
  Filled 2012-01-15: qty 1

## 2012-01-15 MED ORDER — DOPAMINE-DEXTROSE 3.2-5 MG/ML-% IV SOLN
INTRAVENOUS | Status: AC
  Filled 2012-01-15: qty 250

## 2012-01-15 MED ORDER — MIDAZOLAM HCL 5 MG/ML IJ SOLN
1.0000 mg/h | INTRAMUSCULAR | Status: DC
  Administered 2012-01-15: 2 mg/h via INTRAVENOUS
  Administered 2012-01-16: 1 mg/h via INTRAVENOUS
  Administered 2012-01-18: 4 mg/h via INTRAVENOUS
  Filled 2012-01-15 (×6): qty 10

## 2012-01-15 MED ORDER — MIDAZOLAM HCL 2 MG/2ML IJ SOLN
INTRAMUSCULAR | Status: AC
  Administered 2012-01-15: 4 mg
  Filled 2012-01-15: qty 4

## 2012-01-15 MED ORDER — ASPIRIN 81 MG PO CHEW
81.0000 mg | CHEWABLE_TABLET | Freq: Every day | ORAL | Status: DC
  Administered 2012-01-16 – 2012-01-17 (×2): 81 mg via ORAL
  Filled 2012-01-15 (×2): qty 1

## 2012-01-15 MED ORDER — SODIUM BICARBONATE 8.4 % IV SOLN
INTRAVENOUS | Status: DC
  Administered 2012-01-15 – 2012-01-16 (×2): via INTRAVENOUS
  Filled 2012-01-15 (×5): qty 150

## 2012-01-15 MED ORDER — NOREPINEPHRINE BITARTRATE 1 MG/ML IJ SOLN
INTRAMUSCULAR | Status: AC
  Filled 2012-01-15: qty 4

## 2012-01-15 MED ORDER — EPTIFIBATIDE 75 MG/100ML IV SOLN
INTRAVENOUS | Status: AC
  Administered 2012-01-15: 1.994 ug/kg/min via INTRAVENOUS
  Filled 2012-01-15: qty 100

## 2012-01-15 MED ORDER — HYDROCORTISONE SOD SUCCINATE 100 MG IJ SOLR
100.0000 mg | Freq: Every day | INTRAMUSCULAR | Status: AC
  Administered 2012-01-16: 100 mg via INTRAVENOUS
  Filled 2012-01-15: qty 2

## 2012-01-15 MED ORDER — DOPAMINE-DEXTROSE 3.2-5 MG/ML-% IV SOLN
2.0000 ug/kg/min | INTRAVENOUS | Status: DC
  Administered 2012-01-15 – 2012-01-17 (×3): 15 ug/kg/min via INTRAVENOUS
  Administered 2012-01-18: 20 ug/kg/min via INTRAVENOUS
  Administered 2012-01-18: 17 ug/kg/min via INTRAVENOUS
  Administered 2012-01-18: 13 ug/kg/min via INTRAVENOUS
  Administered 2012-01-19: 20 ug/kg/min via INTRAVENOUS
  Filled 2012-01-15 (×8): qty 250

## 2012-01-15 MED ORDER — NOREPINEPHRINE BITARTRATE 1 MG/ML IJ SOLN
2.0000 ug/min | INTRAVENOUS | Status: DC
  Administered 2012-01-15: 30 ug/min via INTRAVENOUS
  Administered 2012-01-16: 10 ug/min via INTRAVENOUS
  Administered 2012-01-16: 8 ug/min via INTRAVENOUS
  Administered 2012-01-16 – 2012-01-17 (×3): 10 ug/min via INTRAVENOUS
  Administered 2012-01-17: 8 ug/min via INTRAVENOUS
  Administered 2012-01-19 (×2): 20 ug/min via INTRAVENOUS
  Administered 2012-01-19: 10 ug/min via INTRAVENOUS
  Filled 2012-01-15 (×11): qty 4

## 2012-01-15 MED ORDER — CHLORHEXIDINE GLUCONATE 0.12 % MT SOLN
15.0000 mL | Freq: Two times a day (BID) | OROMUCOSAL | Status: DC
  Administered 2012-01-16 – 2012-01-23 (×16): 15 mL via OROMUCOSAL
  Filled 2012-01-15 (×19): qty 15

## 2012-01-15 MED ORDER — ATROPINE SULFATE 1 MG/ML IJ SOLN
INTRAMUSCULAR | Status: AC
  Filled 2012-01-15: qty 1

## 2012-01-15 MED ORDER — HYDROCORTISONE SOD SUCCINATE 100 MG IJ SOLR
50.0000 mg | Freq: Four times a day (QID) | INTRAMUSCULAR | Status: AC
  Administered 2012-01-16: 50 mg via INTRAVENOUS
  Filled 2012-01-15: qty 1

## 2012-01-15 MED ORDER — EPINEPHRINE HCL 1 MG/ML IJ SOLN
INTRAMUSCULAR | Status: AC
  Filled 2012-01-15: qty 1

## 2012-01-15 MED ORDER — BIOTENE DRY MOUTH MT LIQD
1.0000 "application " | Freq: Four times a day (QID) | OROMUCOSAL | Status: DC
  Administered 2012-01-16 – 2012-01-24 (×35): 15 mL via OROMUCOSAL

## 2012-01-15 MED ORDER — CLOPIDOGREL BISULFATE 75 MG PO TABS: 75.0000 mg | ORAL_TABLET | Freq: Every day | ORAL | Status: DC

## 2012-01-15 MED ORDER — ONDANSETRON HCL 4 MG/2ML IJ SOLN
4.0000 mg | Freq: Four times a day (QID) | INTRAMUSCULAR | Status: DC | PRN
  Administered 2012-01-16: 4 mg via INTRAVENOUS
  Filled 2012-01-15: qty 2

## 2012-01-15 MED ORDER — NITROGLYCERIN 0.2 MG/ML ON CALL CATH LAB
INTRAVENOUS | Status: AC
  Filled 2012-01-15: qty 1

## 2012-01-15 MED ORDER — EPTIFIBATIDE 75 MG/100ML IV SOLN
2.0000 ug/kg/min | INTRAVENOUS | Status: DC
  Administered 2012-01-15: 1.994 ug/kg/min via INTRAVENOUS
  Administered 2012-01-16: 2 ug/kg/min via INTRAVENOUS
  Filled 2012-01-15 (×3): qty 100

## 2012-01-15 MED ORDER — MORPHINE SULFATE 2 MG/ML IJ SOLN
2.0000 mg | INTRAMUSCULAR | Status: DC | PRN
  Administered 2012-01-15: 2 mg via INTRAVENOUS
  Filled 2012-01-15: qty 1

## 2012-01-15 MED ORDER — FENTANYL CITRATE 0.05 MG/ML IJ SOLN
INTRAMUSCULAR | Status: AC
  Filled 2012-01-15: qty 2

## 2012-01-15 MED ORDER — SODIUM CHLORIDE 0.9 % IV SOLN: INTRAVENOUS | Status: DC

## 2012-01-15 MED ORDER — NITROGLYCERIN IN D5W 200-5 MCG/ML-% IV SOLN
10.0000 ug/min | INTRAVENOUS | Status: DC
  Filled 2012-01-15: qty 250

## 2012-01-15 NOTE — Procedures (Signed)
Intubation Procedure Note DERRAL COLUCCI [id number] Apr 22, 1946  Procedure: Intubation Indications: Airway protection and maintenance  Procedure Details Consent: Unable to obtain consent because of emergent medical necessity. Time Out: Verified patient identification, verified procedure, site/side was marked, verified correct patient position, special equipment/implants available, medications/allergies/relevent history reviewed, required imaging and test results available.  Performed  Maximum sterile technique was used including gloves, hand hygiene and mask.  MAC    Evaluation Hemodynamic Status: BP stable throughout; O2 sats: stable throughout Patient's Current Condition: stable Complications: No apparent complications Patient did tolerate procedure well. Chest X-ray ordered to verify placement.  CXR: pending.   [name] 01/15/2012

## 2012-01-15 NOTE — Consult Note (Signed)
Name: Nathan Mcgee MRN: [id number] DOB: [birth year]    LOS: 1  [facility] / Critical Care Note   History of Present Illness:  66 y/o WM with PMH of CAD, HTN, AAA s/p stenting from aorta to femorals admitted 3/25 after episode of chest pain at rest.  3/26 underwent planned cardiac cath with stent to LAD.  Post cath began having chest pain and returned to Cath Lab. LAD stent had re-occluded and underwent balloon angioplasty with stent to LAD.  Course further complicated by respiratory distress.  PCCM consulted for assistance.  Lines / Drains: 3/26 OETT>>> 3/26 L Femoral Sheath Venous and Arterial>>>  Cultures: None  Antibiotics: None  Tests / Events: 3/26 Re-occlusion of LAD graft with cardiogenic shock.  Past Medical History  Diagnosis Date  . Coronary artery disease   . Hypertension   . AAA (abdominal aortic aneurysm)    Past Surgical History  Procedure Date  . Coronary angioplasty   . Carotid endarterectomy    Prior to Admission medications   Medication Sig Start Date End Date Taking? Authorizing Provider  allopurinol (ZYLOPRIM) 300 MG tablet Take 300 mg by mouth daily.   Yes Historical Provider, MD  amitriptyline (ELAVIL) 25 MG tablet Take 25 mg by mouth at bedtime.   Yes Historical Provider, MD  amLODipine (NORVASC) 5 MG tablet Take 5 mg by mouth daily.   Yes Historical Provider, MD  aspirin 325 MG tablet Take 325 mg by mouth daily.   Yes Historical Provider, MD  atenolol (TENORMIN) 50 MG tablet Take 50 mg by mouth daily.   Yes Historical Provider, MD  benazepril (LOTENSIN) 20 MG tablet Take 20 mg by mouth daily.   Yes Historical Provider, MD  clopidogrel (PLAVIX) 75 MG tablet Take 75 mg by mouth daily.   Yes Historical Provider, MD  fish oil-omega-3 fatty acids 1000 MG capsule Take 2 g by mouth daily.   Yes Historical Provider, MD  HYDROcodone-acetaminophen (LORCET) 10-650 MG per tablet Take 1 tablet by mouth every 6 (six) hours as needed. For pain.   Yes  Historical Provider, MD  levothyroxine (SYNTHROID, LEVOTHROID) 137 MCG tablet Take 137 mcg by mouth daily.   Yes Historical Provider, MD  Methylcellulose, Laxative, (CITRUCEL) 500 MG TABS Take 1,000 mg by mouth daily.   Yes Historical Provider, MD  pantoprazole (PROTONIX) 40 MG tablet Take 40 mg by mouth 2 (two) times daily.   Yes Historical Provider, MD  PARoxetine (PAXIL) 20 MG tablet Take 20 mg by mouth daily.   Yes Historical Provider, MD  simvastatin (ZOCOR) 40 MG tablet Take 40 mg by mouth every evening.   Yes Historical Provider, MD    Allergies Allergies  Allergen Reactions  . Codeine Itching    Family History History reviewed. No pertinent family history.  Social History  reports that he has been smoking Cigarettes.  He has a 25 pack-year smoking history. He does not have any smokeless tobacco history on file. He reports that he does not drink alcohol or use illicit drugs.  Review Of Systems: unable to complete as patient unstable.   Vital Signs: Temp:  [97.6 F (36.4 C)-98.3 F (36.8 C)] 97.6 F (36.4 C) (03/26 1500) Pulse Rate:  [40-85] 53  (03/26 1500) Resp:  [10-19] 18  (03/26 1500) BP: (66-174)/(51-84) 159/78 mmHg (03/26 1500) SpO2:  [97 %-100 %] 99 % (03/26 1500) Weight:  [79 kg (174 lb 2.6 oz)] 79 kg (174 lb 2.6 oz) (03/26 0400) I/O last 3 completed shifts:  In: 890.8 [I.V.:890.8] Out: 2250 [Urine:2250]  Physical Examination: General: wdwn adult male, acute distress Neuro: Awake, lethargic, appears uncomfortable, MAE spontaneously CV: s1s2 rrr PULM: resp's even/non-labored, lungs bilaterally coarse GI: obese/soft,bs x4 active Extremities: warm/dry, grey   Ventilator settings:  PRVC 24, Tv 500, PEEP 5 and FiO2 of 100%.  Labs    CBC  Lab 01/15/12 0210 01/14/12 1747  HGB 14.2 14.5  HCT 40.9 41.1  WBC 9.8 10.6*  PLT 226 242   BMET  Lab 01/15/12 0210 01/14/12 1747  NA 137 136  K 3.7 4.1  CL 103 100  CO2 25 26  GLUCOSE 126* 118*  BUN 16 21   CREATININE 1.21 1.34  CALCIUM 8.4 9.2  MG -- --  PHOS -- --   Lab 01/14/12 1920  INR 0.97    No results found for this basename: PHART:5,PCO2:5,PCO2ART:5,PO2:5,PO2ART:5,HCO3:5,TCO2:5,O2SAT:5 in the last 168 hours   Radiology: 3/26 CXR>>>Pulmonary edema seen by C-arm.   Assessment and Plan:  Acute Respiratory Failure Assessment: secondary to acute cardiogenic pulmonary edema. Plan - Emergent intubation - F/u cxr to ensure placement - F/U ABG. - Full vent support. - No diureses until shock resolves.  CAD / Chest Pain / Status-post CATH with stent to LAD Assessment:  Plan: Per cards, fully anticoagulated.  Shock: cardiogenic in nature, unable to place upper CVL for CVP since patient is on multiple anticoagulants post cath. Plan: Levophed  Dopamine  Vasopressin only as a last resort  Bicarb drip to be stopped after respiratory acidosis resolves post intubation to avoid further fluid overload unless patient remains hypotensive.  No IABP due to prior AAA surgery with multiple stents.  Hope is that once acidosis is addressed then less pressors will be needed.  Check cortisol level.  Stress dose steroids.  Renal: Hx of renal insufficiency, ok for now, place foley and check labs in AM.  GI: No active issues.  Will start TF in AM if still in shock by AM.  Endocrine: no history of diabetes.  Will monitor BS.  ID: No active issues.  Prognosis guarded, hopefully myocardium is stunned now and will improve with improvement of acidosis and reperfusion.  Will likely place a TLC in AM to check CVPs and will not need a PAC.  Best practices / Disposition: -->Code Status: Full -->DVT Px: Fully anticoagulated by cards. -->GI Px: Protonix -->Diet: NPO  CC time 85 minutes.  Alyson Reedy, M.D. [facility]/Critical Care Medicine. Pager: [phone]. After hours pager: [phone].

## 2012-01-15 NOTE — Interval H&P Note (Signed)
History and Physical Interval Note:  01/15/2012 4:35 PM  Pt with onset of chest pain after cath. EKG has now evolved to show ST elevation in the precordial leads with depression laterally. Emergent relook cath now with possible PCI. Discussed with pt and his wife. The various methods of treatment have been discussed with the patient and family. After consideration of risks, benefits and other options for treatment, the patient has consented to  Procedure(s) (LRB): LEFT HEART CATHETERIZATION WITH CORONARY ANGIOGRAM (N/A) as a surgical intervention with possible PCI .  [name] history has been reviewed, patient examined, no change in status, stable for surgery.  I have reviewed the patients' chart and labs.  Questions were answered to the patient's satisfaction.     Nathan Mcgee

## 2012-01-15 NOTE — ED Provider Notes (Signed)
I saw and evaluated the patient, reviewed the resident's note and I agree with the findings and plan.  I saw the patient along with Dr. Criss Alvine and agree with his note, assessment, and plan.  The patient presented complaining of chest discomfort.  He has a long history of peripheral vascular disease mostly treated at [facility], but no cardiac history.  On exam, the heart and lungs were unremarkable and the abdominal exam was benign.  His ekg revealed ST inversions in the anterolateral leads, but we have no prior ekg's on file for comparison.    He was given sl ntg in the ED which caused a profound drop in his blood pressure and heart rate.  He went into a junctional rhythm with a rate of 35 and became less responsive.  He was then given a bolus of ns along with 1 mg of intravenous atropine.  His heart rate then climbed back into the 80's and blood pressures normalized.  Enzymes were negative.  The patient will be admitted to the ICU by the cardiology service for eval of unstable angina/acs.  CRITICAL CARE Performed by: Geoffery Lyons   Total critical care time: 30 minutes  Critical care time was exclusive of separately billable procedures and treating other patients.  Critical care was necessary to treat or prevent imminent or life-threatening deterioration.  Critical care was time spent personally by me on the following activities: development of treatment plan with patient and/or surrogate as well as nursing, discussions with consultants, evaluation of patient's response to treatment, examination of patient, obtaining history from patient or surrogate, ordering and performing treatments and interventions, ordering and review of laboratory studies, ordering and review of radiographic studies, pulse oximetry and re-evaluation of patient's condition.    Geoffery Lyons, MD 01/15/12 [phone]

## 2012-01-15 NOTE — Progress Notes (Signed)
UR Completed. Simmons, Michaelyn Wall F [phone]  

## 2012-01-15 NOTE — CV Procedure (Signed)
Cardiac Catheterization Operative Report  Nathan Mcgee [id number] 3/26/20132:29 PM No primary provider on file.  Procedure Performed:  1. Left Heart Catheterization 2. Selective Coronary Angiography 3. Left ventricular angiogram 4. PTCA/DES x 1 proximal LAD 5. PTCA/DES x 1 proximal Circumflex  Operator: Verne Carrow, MD  Arterial access site:  Right radial artery.   Indication: Unstable angina.                                  Procedure Details: The risks, benefits, complications, treatment options, and expected outcomes were discussed with the patient. The patient and/or family concurred with the proposed plan, giving informed consent. The patient was brought to the cath lab after IV hydration was begun and oral premedication was given. The patient was further sedated with Versed and Fentanyl. The right wrist was assessed with an Allens test which was positive. The right wrist was prepped and draped in a sterile fashion. 1% lidocaine was used for local anesthesia. Using the modified Seldinger access technique, a 5 French sheath was placed in the right radial artery. 2.5  mg Nicardipine was given through the sheath. 4000 units IV heparin was given. Standard diagnostic catheters were used to perform selective coronary angiography. A pigtail catheter was used to perform a left ventricular angiogram. The patient was found to have severe stenoses in the proximal segment of the Circumflex and the proximal segment of the LAD. A 5 French EBU 3.5 guiding catheter was used to engage the left main. A Cougar IC wire was advanced down the Circumflex artery. A 2.5 x 12 mm balloon was used to pre-dilate the stenosis. I then deployed a 3.5 x 12 mm Promus Element DES in the proximal Circumflex. This was post-dilated with a 4.0 x 8 mm Durant balloon x 1. There was an excellent result in the Circumflex. I then turned my attention to the LAD. A Cougar IC wire was advanced down the LAD. A 2.5 x 12 mm  balloon was used to post-dilate the proximal to mid LAD. This lesion involved a small caliber diagonal branch. A 3.0 x 16 mm Promus Element DES was deployed in the mid vessel. This was post-dilated with a 3.25 x 8 mm  balloon x 2. There was excellent flow down the LAD, however, the small diagonal branch was compromised by the stent and had TIMI-1 flow. I thought this vessel was too small for PCI. The wire and guiding catheter were removed.   The sheath was removed from the right radial artery and a hemostasis band was applied at the arteriotomy site on the right wrist.  There were no immediate complications. The patient was taken to the recovery area in stable condition.   Hemodynamic Findings: Central aortic pressure: 151/86 Left ventricular pressure: 159/16/34  Angiographic Findings:  Left main: No obstructive disease.   Left Anterior Descending Artery: Proximal vessel has mild plaque followed by a 99% stenosis in the proximal to mid segment. There is a small caliber diagonal branch that arises just prior to the stenosis in the LAD. The mid and distal vessel has scattered 40% plaque.   Circumflex Artery: Large caliber, dominant vessel with ulcerated, 99% proximal stenosis. Mild plaque in OM1.  Right Coronary Artery: Small, non-dominant vessel with 50% ostial stenosis.   Left Ventricular Angiogram: LVEF=55-60%.    Impression: 1. Double vessel CAD 2. Normal LV systolic function 3. Successful PTCA/DES x 1 proximal LAD 4.  Successful PTCA/DES x 1 proximal Circumflex  Recommendations: ASA and Plavix for at least one year. Aggressive risk factor modification.        Complications:  None. The patient tolerated the procedure well.

## 2012-01-15 NOTE — Progress Notes (Signed)
ANTICOAGULATION CONSULT NOTE - Initial Consult  Pharmacy Consult for Heparin Indication: chest pain/ACS  Allergies  Allergen Reactions  . Codeine Itching    Patient Measurements: Height: 5\' 8"  (172.7 cm) Weight: 174 lb 2.6 oz (79 kg) IBW/kg (Calculated) : 68.4   Vital Signs: Temp: 97.6 F (36.4 C) (03/26 0000) Temp src: Axillary (03/26 0000) BP: 136/63 mmHg (03/26 0300) Pulse Rate: 53  (03/26 0300)  Labs:  Basename 01/15/12 0210 01/14/12 2054 01/14/12 1920 01/14/12 1747  HGB 14.2 -- -- 14.5  HCT 40.9 -- -- 41.1  PLT 226 -- -- 242  APTT -- -- 32 --  LABPROT -- -- 13.1 --  INR -- -- 0.97 --  HEPARINUNFRC <0.10* -- -- --  CREATININE 1.21 -- -- 1.34  CKTOTAL -- 100 -- --  CKMB -- 2.8 -- --  TROPONINI -- <0.30 -- <0.30   Estimated Creatinine Clearance: 58.1 ml/min (by C-G formula based on Cr of 1.21).  Assessment: 66 yo male with chest pain for Heparin  Goal of Therapy:  Heparin level 0.3-0.7 units/ml   Plan:  Heparin 2000 units IV bolus, then increase heparin 1400 units/hr F/U after cath.  Geannie Risen, PharmD, BCPS  01/15/2012, 3:40 AM

## 2012-01-15 NOTE — H&P (Signed)
Pt c/o chest pressure with pain to back, under shoulder blade and lt arm. Pain score =3. EKG done with no changes noted. Morphine 2 mg given, will cont to reassess.

## 2012-01-15 NOTE — Interval H&P Note (Signed)
History and Physical Interval Note:  01/15/2012 12:53 PM  Nathan Mcgee  has presented today for cardiac cath  with the diagnosis of cp  The various methods of treatment have been discussed with the patient and family. After consideration of risks, benefits and other options for treatment, the patient has consented to  Procedure(s) (LRB): LEFT HEART CATHETERIZATION WITH CORONARY ANGIOGRAM (N/A) as a surgical intervention .  [name] history has been reviewed, patient examined, no change in status, stable for surgery.  I have reviewed the patients' chart and labs.  Questions were answered to the patient's satisfaction.     Wilberto Console

## 2012-01-15 NOTE — CV Procedure (Signed)
Cardiac Catheterization Operative Report  Nathan Mcgee [id number] 3/26/20136:16 PM No primary provider on file.  Procedure Performed:  1. Coronary angiography Left system 2. PTCA/DES x 1 proximal LAD  Operator: Verne Carrow, MD  Indication:  Nathan Mcgee was brought to the cath lab earlier today for diagnostic cath. He presented with unstable angina yesterday. His cardiac enzymes were negative. He is known to have severe vascular disease. He was found to have a 99% stenosis of the proximal LAD and a 99% ulcerated plaque in the proximal Circumflex. I placed a drug eluting stent in the proximal to mid LAD and a drug eluting stent in the proximal Circumflex. A small diagonal branch was jailed by the LAD stent. He was noted to have disease in the distal segment of the LAD where the vessel became small caliber. He did well following the procedure but began to have severe substernal chest pain. His EKG showed ST segment elevation in the anterior leads. He was brought back emergently to the cath lab for relook cath.                                  Procedure Details: The risks, benefits, complications, treatment options, and expected outcomes were discussed with the patient. The patient and/or family concurred with the proposed plan, giving informed consent. The patient was brought to the cath lab emergently. The patient was further sedated with Versed. The left groin was prepped and draped in the usual manner. Using the modified Seldinger access technique, a 6 French sheath was placed in the left femoral artery. The patient was noted to have stents extending from the distal aorta down to the left common femoral artery. I engaged the left main with a JL-4 catheter. Angiography demonstrated the proximal LAD to be occluded, proximal to the stent. The patient wast given a bolus of Angiomax and a drip was started. When the ACT was greater than 200, I passed a Cougar IC wire down the LAD. He was given a  double bolus of Integrilin and a drip was started. A 2.5 x 15 mm balloon was used to open the LAD. This was inflated in the proximal vessel extending back to ostium. Flow was restored down the LAD. At this point, he was hemodynamically stable. A 3.0 x 20 mm Promus Element DES was deployed in the proximal LAD extending back to the ostium and overlapping with the previously placed stent. A 3.25 x 15 mm Grayling balloon was used to post-dilate the stented segment. This balloon was used to dilate both stents. At this point, there was good flow down the vessel. The distal LAD had disease in a portion of the vessel where there was tapering to a smaller caliber vessel. It appeared that there may be a small dissection in the distal LAD but the vessel was too small for stenting. There was excellent flow down the vessel. The patients chest pain resolved however he began to have bradycardia with reperfusion arrythmia with VT. He then had asystole. He was given atropine and epinephrine IV. He was also given two 150 mg boluses of amiodarone and a drip was started. At this point, he became hypotensive and was started on a dopamine gtt. IVF infusion was maximized and he was started on a Levophed drip. Fortunately, we were able to stabilize his rhythm and blood pressure. A PCCM consult was placed for intubation for increased work of breathing. The  patient was intubated in the cath lab. We considered placement of an intra-aortic balloon pump but the patient has severe peripheral vascular disease with stents extending from the infrarenal aorta down through both external iliac arteries. I did not think an IABP would be safe.   His BP was stable in the cath lab on vasopressors. He was taken to the CCU in critical condition in cardiogenic shock.  Hemodynamic Findings: Central aortic pressure: 167/84  Impression: 1. Acute occlusion of LAD stent secondary to stent thrombosis.  2. Successful PTCA/DES placement in proximal LAD 3.  Cardiogenic shock 4. Respiratory Failure  Recommendations: He will be admitted to the CCU. Continue mechanical ventilation, vasopressors. Will run Integrilin drip for next 24 hours. He has been loaded with Plavix but given stent thrombosis, I think we should switch to Nathan Mcgee. Continue ASA. D/C beta blocker with hypotension.        Complications:  Cardiogenic shock.

## 2012-01-15 NOTE — Procedures (Signed)
Central Venous Catheter Insertion Procedure Note Nathan Mcgee [id number] Mar 19, 1946  Procedure: Insertion of Central Venous Catheter Indications: Assessment of intravascular volume and Drug and/or fluid administration  Procedure Details Consent: Risks of procedure as well as the alternatives and risks of each were explained to the (patient/caregiver).  Consent for procedure obtained. Time Out: Verified patient identification, verified procedure, site/side was marked, verified correct patient position, special equipment/implants available, medications/allergies/relevent history reviewed, required imaging and test results available.  Performed  Maximum sterile technique was used including antiseptics, cap, gloves, gown, hand hygiene, mask and sheet. Skin prep: Chlorhexidine; local anesthetic administered A antimicrobial bonded/coated triple lumen catheter was placed in the right internal jugular vein using the Seldinger technique. Ultrasound used for vessel identification; secured at 17cm.   Evaluation Blood flow good Complications: No apparent complications Patient did tolerate procedure well. Chest X-ray ordered to verify placement.  CXR: pending.  MCQUAID, DOUGLAS 01/15/2012, 9:00 PM

## 2012-01-16 ENCOUNTER — Inpatient Hospital Stay (HOSPITAL_COMMUNITY): Payer: Medicare Other

## 2012-01-16 DIAGNOSIS — J9600 Acute respiratory failure, unspecified whether with hypoxia or hypercapnia: Secondary | ICD-10-CM

## 2012-01-16 DIAGNOSIS — I219 Acute myocardial infarction, unspecified: Secondary | ICD-10-CM

## 2012-01-16 DIAGNOSIS — I369 Nonrheumatic tricuspid valve disorder, unspecified: Secondary | ICD-10-CM

## 2012-01-16 DIAGNOSIS — R570 Cardiogenic shock: Secondary | ICD-10-CM

## 2012-01-16 LAB — POCT I-STAT 3, ART BLOOD GAS (G3+)
Acid-base deficit: 2 mmol/L (ref 0.0–2.0)
Bicarbonate: 21.7 mEq/L (ref 20.0–24.0)
Patient temperature: 98.6
TCO2: 23 mmol/L (ref 0–100)
pH, Arterial: 7.411 (ref 7.350–7.450)
pO2, Arterial: 186 mmHg — ABNORMAL HIGH (ref 80.0–100.0)

## 2012-01-16 LAB — BASIC METABOLIC PANEL
Chloride: 102 mEq/L (ref 96–112)
GFR calc Af Amer: 60 mL/min — ABNORMAL LOW (ref >90)
GFR calc Af Amer: 62 mL/min — ABNORMAL LOW (ref >90)
GFR calc non Af Amer: 52 mL/min — ABNORMAL LOW (ref >90)
Potassium: 3.3 mEq/L — ABNORMAL LOW (ref 3.5–5.1)
Potassium: 3.8 mEq/L (ref 3.5–5.1)
Sodium: 136 mEq/L (ref 135–145)

## 2012-01-16 LAB — CARDIAC PANEL(CRET KIN+CKTOT+MB+TROPI)
CK, MB: 424.7 ng/mL (ref 0.3–4.0)
Relative Index: 9.2 — ABNORMAL HIGH (ref 0.0–2.5)
Total CK: 3591 U/L — ABNORMAL HIGH (ref 7–232)
Total CK: 2750 U/L — ABNORMAL HIGH (ref 7–232)

## 2012-01-16 LAB — LACTIC ACID, PLASMA: Lactic Acid, Venous: 4 mmol/L — ABNORMAL HIGH (ref 0.5–2.2)

## 2012-01-16 LAB — CBC
HCT: 38.5 % — ABNORMAL LOW (ref 39.0–52.0)
Hemoglobin: 13.1 g/dL (ref 13.0–17.0)
MCH: 29.9 pg (ref 26.0–34.0)
MCV: 87.9 fL (ref 78.0–100.0)
Platelets: 295 10*3/uL (ref 150–400)
RBC: 4.48 MIL/uL (ref 4.22–5.81)
RDW: 13.5 % (ref 11.5–15.5)
RDW: 13.6 % (ref 11.5–15.5)
WBC: 23.5 10*3/uL — ABNORMAL HIGH (ref 4.0–10.5)
WBC: 27.9 10*3/uL — ABNORMAL HIGH (ref 4.0–10.5)

## 2012-01-16 LAB — TSH: TSH: 1.282 u[IU]/mL (ref 0.350–4.500)

## 2012-01-16 LAB — PHOSPHORUS: Phosphorus: 0.8 mg/dL — CL (ref 2.3–4.6)

## 2012-01-16 MED ORDER — ATORVASTATIN CALCIUM 80 MG PO TABS
80.0000 mg | ORAL_TABLET | Freq: Every day | ORAL | Status: DC
  Administered 2012-01-16 – 2012-01-17 (×2): 80 mg via ORAL
  Filled 2012-01-16 (×3): qty 1

## 2012-01-16 MED ORDER — SODIUM CHLORIDE 0.9 % IV SOLN
INTRAVENOUS | Status: DC
  Administered 2012-01-17: 18:00:00 via INTRAVENOUS

## 2012-01-16 MED ORDER — TICAGRELOR 90 MG PO TABS
90.0000 mg | ORAL_TABLET | Freq: Once | ORAL | Status: AC
  Administered 2012-01-16: 90 mg via ORAL
  Filled 2012-01-16: qty 1

## 2012-01-16 MED ORDER — OSMOLITE 1.2 CAL PO LIQD
1000.0000 mL | ORAL | Status: DC
  Administered 2012-01-16: 1000 mL
  Administered 2012-01-16: 20 mL
  Administered 2012-01-18 – 2012-01-21 (×5): 1000 mL
  Filled 2012-01-16 (×13): qty 1000

## 2012-01-16 MED ORDER — FUROSEMIDE 10 MG/ML IJ SOLN
40.0000 mg | Freq: Once | INTRAMUSCULAR | Status: AC
  Administered 2012-01-16: 40 mg via INTRAVENOUS
  Filled 2012-01-16: qty 4

## 2012-01-16 MED ORDER — PRO-STAT SUGAR FREE PO LIQD
30.0000 mL | Freq: Three times a day (TID) | ORAL | Status: DC
  Administered 2012-01-16 – 2012-01-22 (×17): 30 mL
  Filled 2012-01-16 (×27): qty 30

## 2012-01-16 MED ORDER — POTASSIUM PHOSPHATE DIBASIC 3 MMOLE/ML IV SOLN
30.0000 mmol | Freq: Once | INTRAVENOUS | Status: AC
  Administered 2012-01-16: 30 mmol via INTRAVENOUS
  Filled 2012-01-16: qty 10

## 2012-01-16 MED ORDER — MAGNESIUM SULFATE 40 MG/ML IJ SOLN
2.0000 g | Freq: Once | INTRAMUSCULAR | Status: AC
  Administered 2012-01-16: 2 g via INTRAVENOUS
  Filled 2012-01-16: qty 50

## 2012-01-16 MED ORDER — "THROMBI-PAD 3""X3"" EX PADS"
1.0000 | MEDICATED_PAD | Freq: Once | CUTANEOUS | Status: AC
  Administered 2012-01-16: 1 via TOPICAL
  Filled 2012-01-16: qty 1

## 2012-01-16 MED ORDER — TICAGRELOR 90 MG PO TABS
90.0000 mg | ORAL_TABLET | Freq: Two times a day (BID) | ORAL | Status: DC
  Administered 2012-01-16 – 2012-01-17 (×4): 90 mg via ORAL
  Filled 2012-01-16 (×6): qty 1

## 2012-01-16 MED FILL — Atropine Sulfate Inj 0.1 MG/ML: INTRAMUSCULAR | Qty: 10 | Status: AC

## 2012-01-16 MED FILL — Dextrose Inj 5%: INTRAVENOUS | Qty: 50 | Status: AC

## 2012-01-16 NOTE — Progress Notes (Signed)
INITIAL ADULT NUTRITION ASSESSMENT Date: 01/16/2012   Time: 9:20 AM Reason for Assessment: Low Braden, vent day 1, consulted for initiation and management of TF  ASSESSMENT: Male 66 y.o.  Dx: Chest pain  Hx:  Past Medical History  Diagnosis Date  . Coronary artery disease   . Hypertension   . AAA (abdominal aortic aneurysm)     Related Meds:     . amiodarone      . amiodarone      . amitriptyline  25 mg Oral QHS  . antiseptic oral rinse  1 application Mouth Rinse QID  . aspirin  81 mg Oral Daily  . atorvastatin  80 mg Oral q1800  . atropine      . bivalirudin      . bivalirudin      . chlorhexidine  15 mL Mouth/Throat BID  . clopidogrel      . DOPamine      . EPINEPHrine      . fentaNYL      . fentaNYL      . furosemide  40 mg Intravenous Once  . heparin      . heparin      . heparin      . hydrocortisone sodium succinate  100 mg Intravenous Daily   Followed by  . hydrocortisone sodium succinate  50 mg Intravenous Q6H  . HYDROmorphone      . levothyroxine  137 mcg Oral QAC breakfast  . lidocaine      . lidocaine      . midazolam      . midazolam      . midazolam      . midazolam      . mupirocin ointment  1 application Nasal BID  . naloxone      . nitroGLYCERIN      . norepinephrine      . ondansetron      . ondansetron      . ondansetron      . pantoprazole (PROTONIX) IV  40 mg Intravenous Q24H  . PARoxetine  20 mg Oral Daily  . sodium bicarbonate      . Thrombi-Pad  1 each Topical Once  . Ticagrelor  90 mg Oral Once  . Ticagrelor  90 mg Oral BID  . DISCONTD: aspirin EC  325 mg Oral Daily  . DISCONTD: aspirin EC  325 mg Oral Daily  . DISCONTD: atenolol  25 mg Oral Daily  . DISCONTD: benazepril  10 mg Oral Daily  . DISCONTD: Chlorhexidine Gluconate Cloth  6 each Topical Q0600  . DISCONTD: clopidogrel  75 mg Oral Q breakfast  . DISCONTD: simvastatin  40 mg Oral q1800  . DISCONTD: sodium chloride  1,000 mL Intravenous Once     Ht: 5\' 8"  (172.7  cm)  Wt: 174 lb 2.6 oz (79 kg)  Ideal Wt: 70 kg % Ideal Wt: 113%  Usual Wt: 175 lbs (79.5 kg) % Usual Wt: 100%  Body mass index is 26.48 kg/(m^2). Overweight  Food/Nutrition Related Hx: Per family, no problems PTA  Labs:  CMP     Component Value Date/Time   NA 136 01/16/2012 0200   K 3.3* 01/16/2012 0200   CL 101 01/16/2012 0200   CO2 24 01/16/2012 0200   GLUCOSE 203* 01/16/2012 0200   BUN 19 01/16/2012 0200   CREATININE 1.38* 01/16/2012 0200   CALCIUM 7.7* 01/16/2012 0200   GFRNONAA 52* 01/16/2012 0200   GFRAA 60* 01/16/2012 0200  Intake/Output Summary (Last 24 hours) at 01/16/12 0934 Last data filed at 01/16/12 0700  Gross per 24 hour  Intake 2822.9 ml  Output   1110 ml  Net 1712.9 ml     Diet Order:   NPO  Supplements/Tube Feeding: none  IVF:    amiodarone (NEXTERONE PREMIX) 360 mg/200 mL dextrose   DOPamine Last Rate: 15 mcg/kg/min (01/15/12 2135)  fentaNYL infusion INTRAVENOUS Last Rate: 50 mcg/hr (01/15/12 2138)  midazolam (VERSED) infusion Last Rate: 2 mg/hr (01/15/12 2137)  norepinephrine (LEVOPHED) Adult infusion Last Rate: 8 mcg/min (01/16/12 0618)  sodium bicarbonate infusion 1000 mL Last Rate: 100 mL/hr at 01/16/12 0844  DISCONTD: sodium chloride Last Rate: 75 mL/hr at 01/15/12 1043  DISCONTD: sodium chloride   DISCONTD: eptifibatide Last Rate: 2 mcg/kg/min (01/16/12 0444)  DISCONTD: heparin Last Rate: 1,400 Units/hr (01/15/12 0700)  DISCONTD: nitroGLYCERIN    TMax: 37.1  VE 13.9  Estimated Nutritional Needs:   Kcal: 1900 Protein: 120-140 gm Fluid:  1.9 L  Pt admitted with chest pain, planned heart cath on 3/26. Additional chest pain after cath, emergent re-look cath on 3/36, sent had re-occluded. After procedure pt with respiratory distress, emergent intubation with full vent support.  Per MD note, hoping to wean vent and pressors in the next 24 hours.  Patient with low Braden score associated with NPO diet and being sedated.  Currently with  OG tube. CXR still pending to varrify placement.   NUTRITION DIAGNOSIS: -Inadequate oral intake (NI-2.1).  Status: Ongoing  RELATED TO: Inability to eat, on vent  AS EVIDENCE BY: NPO diet  MONITORING/EVALUATION(Goals): Goal: EN to meet >90% of estimated nutrition needs.  Monitor: TF, vent/extubation, weight, labs, I/O's  EDUCATION NEEDS: -No education needs identified at this time  INTERVENTION: 1. Once placement of OG tube is verified, initiate TF. Begin Osmolite 1.2 via OG tube at a rate of 20 ml/hr and advance by 10 ml every 4 hours to a goal rate of 60 ml/hr.  2. Provide additional Pro-stat TID via tube 3. This will provide 1944 kcal (102% estimated needs), 125 gm protein (104% estimated needs) and 1180 ml free water.  4. RD will continue to monitor   Dietitian [phone]  DOCUMENTATION CODES Per approved criteria  -Not Applicable    Clarene Duke MARIE 01/16/2012, 9:20 AM

## 2012-01-16 NOTE — Procedures (Signed)
Arterial Catheter Insertion Procedure Note Nathan Mcgee [id number] [birth year]  Procedure: Insertion of Arterial Catheter  Indications: Blood pressure monitoring  Procedure Details Consent: Unable to obtain consent because of altered level of consciousness. Time Out: Verified patient identification, verified procedure, site/side was marked, verified correct patient position, special equipment/implants available, medications/allergies/relevent history reviewed, required imaging and test results available.  Performed  Maximum sterile technique was used including antiseptics, gloves, hand hygiene and sheet. Skin prep: Chlorhexidine; local anesthetic administered 20 gauge catheter was inserted into left radial artery using the Seldinger technique.  Evaluation Blood flow good; BP tracing good. Complications: No apparent complications.   Nathan Mcgee 01/16/2012

## 2012-01-16 NOTE — Progress Notes (Signed)
HPI:  66 y/o WM with PMH of CAD, HTN, AAA s/p stenting from aorta to femorals admitted 3/25 after episode of chest pain at rest. 3/26 underwent planned cardiac cath with stent to LAD. Post cath began having chest pain and returned to Cath Lab. LAD stent had re-occluded and underwent balloon angioplasty with stent to LAD. Course further complicated by respiratory distress. PCCM consulted for assistance.  Antibiotics:   None  Cultures/Sepsis Markers:   None  Access/Protocols:  3/26 OETT>>>  3/26 L Femoral Sheath Venous and Arterial>>> 3/26 R IJ TLC>>>  Best Practice: DVT: Fully anticoag by cards GI: Protonix  Subjective:   Physical Exam: Filed Vitals:   01/16/12 0744  BP:   Pulse:   Temp: 97.9 F (36.6 C)  Resp:   BP 130/69, RR 28, HR 60, sat 98% on 50%  Intake/Output Summary (Last 24 hours) at 01/16/12 0903 Last data filed at 01/16/12 0700  Gross per 24 hour  Intake 2822.9 ml  Output   1110 ml  Net 1712.9 ml   Vent Mode:  [-] PRVC FiO2 (%):  [50 %-100 %] 50 % Set Rate:  [28 bmp] 28 bmp Vt Set:  [500 mL] 500 mL PEEP:  [5 cmH20-10 cmH20] 8 cmH20 Plateau Pressure:  [21 cmH20-26 cmH20] 21 cmH20  Neuro: Sedated and intubated. Cardiac: RRR, Nl S1/S2, -M/R/G. Pulmonary: Diffuse crackles. GI: Soft, NT, ND and +BS. Extremities: 1+ edema.  Labs: CBC    Component Value Date/Time   WBC 23.5* 01/16/2012 0440   RBC 4.38 01/16/2012 0440   HGB 13.1 01/16/2012 0440   HCT 38.5* 01/16/2012 0440   PLT 289 01/16/2012 0440   MCV 87.9 01/16/2012 0440   MCH 29.9 01/16/2012 0440   MCHC 34.0 01/16/2012 0440   RDW 13.5 01/16/2012 0440   LYMPHSABS 1.5 01/14/2012 1747   MONOABS 0.5 01/14/2012 1747   EOSABS 0.4 01/14/2012 1747   BASOSABS 0.0 01/14/2012 1747   BMET    Component Value Date/Time   NA 136 01/16/2012 0200   K 3.3* 01/16/2012 0200   CL 101 01/16/2012 0200   CO2 24 01/16/2012 0200   GLUCOSE 203* 01/16/2012 0200   BUN 19 01/16/2012 0200   CREATININE 1.38* 01/16/2012 0200   CALCIUM  7.7* 01/16/2012 0200   GFRNONAA 52* 01/16/2012 0200   GFRAA 60* 01/16/2012 0200   ABG    Component Value Date/Time   PHART 7.411 01/16/2012 0422   PCO2ART 34.1* 01/16/2012 0422   PO2ART 186.0* 01/16/2012 0422   HCO3 21.7 01/16/2012 0422   TCO2 23 01/16/2012 0422   ACIDBASEDEF 2.0 01/16/2012 0422   O2SAT 100.0 01/16/2012 0422    Lab 01/16/12 0200  MG 1.6   Lab Results  Component Value Date   CALCIUM 7.7* 01/16/2012   PHOS 0.8* 01/16/2012    Chest Xray:   Assessment & Plan: Acute Respiratory Failure  Assessment: secondary to acute cardiogenic pulmonary edema.  Plan   - F/u cxr to ensure placement   - F/U ABG.   - Full vent support, decrease PEEP to 5 and FiO2 to 40%.   - No diureses until shock resolves.   - ET tube ok position.  CAD / Chest Pain / Status-post CATH with stent to LAD and cardiogenic shock. Assessment:  Plan:  - Per cards, fully anticoagulated.   - Shock: cardiogenic in nature, unable to place upper CVL for CVP since patient is on multiple anticoagulants post cath.   - Levophed 20, will attempt to decrease levo  before dopa.  - Dopamine at 15  - Vasopressin only as a last resort   - D/C Bicarb drip.   - No IABP due to prior AAA surgery with multiple stents.   - Hope is that once acidosis is addressed then less pressors will be needed.   - Check cortisol level.   - Stress dose steroids to be continued for now   Renal: Hx of renal insufficiency, ok for now, place foley and check labs in AM.   GI: No active issues. Will start TF in AM if still in shock by AM.   Endocrine: no history of diabetes. Will monitor BS.   ID: Leukocytosis likely due to steroids, no evidence of infection, CXR is atelectasis and ET tube aspirate is clear without fever.   The patient is critically ill with multiple organ systems failure and requires high complexity decision making for assessment and support, frequent evaluation and titration of therapies, application of advanced  monitoring technologies and extensive interpretation of multiple databases. Critical Care Time devoted to patient care services described in this note is 35 minutes.  [name], MD

## 2012-01-16 NOTE — Progress Notes (Signed)
01/16/2012  09:30:00  Lt fem and venous sheaths removed. Manual pressure held for 30 min. Lt groin slightly bruised, but soft. Pressure dressing applied. Pt was intubated and sedated no instructions given. CCU nurse Tanya-RN observed lt groin. Level 0 noted. BP was 98-100/60's, HR 58. Lt dp was palpated , but very faint. A doppler pulse was also obtained.No problems or complications.

## 2012-01-16 NOTE — Progress Notes (Signed)
  Echocardiogram 2D Echocardiogram has been performed.  Nathan Mcgee, Real Cons 01/16/2012, 11:35 AM

## 2012-01-16 NOTE — Progress Notes (Signed)
SUBJECTIVE: Remains intubated/sedated.   BP 125/64  Pulse 58  Temp(Src) 98.7 F (37.1 C) (Oral)  Resp 28  Ht 5\' 8"  (1.727 m)  Wt 174 lb 2.6 oz (79 kg)  BMI 26.48 kg/m2  SpO2 95%  Intake/Output Summary (Last 24 hours) at 01/16/12 0709 Last data filed at 01/16/12 0618  Gross per 24 hour  Intake 2829.1 ml  Output   1485 ml  Net 1344.1 ml    PHYSICAL EXAM General: Sedated on vent.  Psych:  Good affect, responds appropriately Neck: No JVD. No masses noted.  Lungs: Clear bilaterally with no wheezes or rhonci noted.  Heart: RRR with no murmurs noted. Abdomen: Bowel sounds are present. Soft, non-tender.  Extremities: No lower extremity edema.   LABS: Basic Metabolic Panel:  Basename 01/15/12 2335 01/15/12 0210  NA 136 137  K 3.8 3.7  CL 102 103  CO2 23 25  GLUCOSE 304* 126*  BUN 17 16  CREATININE 1.34 1.21  CALCIUM 7.3* 8.4  MG -- --  PHOS -- --   CBC:  Basename 01/16/12 0440 01/15/12 2335 01/14/12 1747  WBC 23.5* 27.9* --  NEUTROABS -- -- 8.2*  HGB 13.1 13.4 --  HCT 38.5* 39.8 --  MCV 87.9 88.8 --  PLT 289 295 --    Current Meds:    . amiodarone      . amiodarone      . amitriptyline  25 mg Oral QHS  . antiseptic oral rinse  1 application Mouth Rinse QID  . aspirin  81 mg Oral Daily  . atropine      . bivalirudin      . bivalirudin      . chlorhexidine  15 mL Mouth/Throat BID  . clopidogrel      . DOPamine      . EPINEPHrine      . fentaNYL      . fentaNYL      . heparin      . heparin      . heparin      . hydrocortisone sodium succinate  100 mg Intravenous Daily   Followed by  . hydrocortisone sodium succinate  50 mg Intravenous Q6H  . HYDROmorphone      . levothyroxine  137 mcg Oral QAC breakfast  . lidocaine      . lidocaine      . midazolam      . midazolam      . midazolam      . midazolam      . mupirocin ointment  1 application Nasal BID  . naloxone      . nitroGLYCERIN      . norepinephrine      . ondansetron      .  ondansetron      . ondansetron      . pantoprazole (PROTONIX) IV  40 mg Intravenous Q24H  . PARoxetine  20 mg Oral Daily  . simvastatin  40 mg Oral q1800  . sodium bicarbonate      . Thrombi-Pad  1 each Topical Once  . Ticagrelor  90 mg Oral Once  . DISCONTD: aspirin EC  325 mg Oral Daily  . DISCONTD: aspirin EC  325 mg Oral Daily  . DISCONTD: atenolol  25 mg Oral Daily  . DISCONTD: benazepril  10 mg Oral Daily  . DISCONTD: Chlorhexidine Gluconate Cloth  6 each Topical Q0600  . DISCONTD: clopidogrel  75 mg Oral Q breakfast  .  DISCONTD: sodium chloride  1,000 mL Intravenous Once     ASSESSMENT AND PLAN:  1. Unstable angina/CAD: Pt admitted two days ago with unstable angina.  His cardiac enzymes were negative. He is known to have severe vascular disease. He was found to have a 99% stenosis of the proximal LAD and a 99% ulcerated plaque in the proximal Circumflex. I placed a drug eluting stent in the proximal to mid LAD and a drug eluting stent in the proximal Circumflex. A small diagonal branch was jailed by the LAD stent. He was noted to have disease in the distal segment of the LAD where the vessel became small caliber. He did well following the first procedure but began to have severe substernal chest pain one hour after his cath. His EKG showed ST segment elevation in the anterior leads. He was brought back emergently to the cath lab for relook cath. His LAD was occluded proximally, felt to be secondary to stent thrombosis vs dissection proximally. I opened the LAD and placed a DES in the proximal LAD extending back to the ostium. He went into cardiogenic shock with asystole and VT in the cath lab. He was resuscitated but required intubation for resp failure and vasopressors for hemodynamic support. I could not place an IABP because of his severe peripheral vascular disease. He has been stable overnight and his Levophed has been weaned but he is still requiring Levophed and Dopamine for  hemodynamic support. His Plavix was stopped and he has been started on Brilinta. Integrilin was used overnight but he has oozed from his groin site and he has had nosebleeds so Integrilin was stopped.  -Continue ASA/Brilinta/statin. Integrilin has been stopped.  -No beta blocker secondary to hypotension -Will give 40 mg IV Lasix this am. -Check echo today to assess LV function post stent thrombosis with likely affect on the anterior wall.  -Wean pressors as tolerated  2. Acute respiratory failure: Appreciate PCCM assistance. I am hopeful that he will wean quickly from the ventilator over the next 24 hours. CXR this am.   3. Cardiogenic shock: As above. Wean pressors as tolerated.   4. Dispo: Critically ill. Long discussion with family at bedside. Continue CCU care. SCD's bilaterally.    Tameika Heckmann  3/27/20137:09 AM

## 2012-01-17 ENCOUNTER — Inpatient Hospital Stay (HOSPITAL_COMMUNITY): Payer: Medicare Other

## 2012-01-17 DIAGNOSIS — R570 Cardiogenic shock: Secondary | ICD-10-CM

## 2012-01-17 DIAGNOSIS — E872 Acidosis: Secondary | ICD-10-CM

## 2012-01-17 DIAGNOSIS — J810 Acute pulmonary edema: Secondary | ICD-10-CM

## 2012-01-17 DIAGNOSIS — J9600 Acute respiratory failure, unspecified whether with hypoxia or hypercapnia: Secondary | ICD-10-CM

## 2012-01-17 LAB — GLUCOSE, CAPILLARY
Glucose-Capillary: 156 mg/dL — ABNORMAL HIGH (ref 70–99)
Glucose-Capillary: 155 mg/dL — ABNORMAL HIGH (ref 70–99)
Glucose-Capillary: 106 mg/dL — ABNORMAL HIGH (ref 70–99)
Glucose-Capillary: 108 mg/dL — ABNORMAL HIGH (ref 70–99)

## 2012-01-17 LAB — CARDIAC PANEL(CRET KIN+CKTOT+MB+TROPI)
Relative Index: 6.8 — ABNORMAL HIGH (ref 0.0–2.5)
Total CK: 1696 U/L — ABNORMAL HIGH (ref 7–232)
Troponin I: >25 ng/mL (ref <0.30)

## 2012-01-17 LAB — CBC
HCT: 33.6 % — ABNORMAL LOW (ref 39.0–52.0)
MCV: 86.4 fL (ref 78.0–100.0)
RBC: 3.89 MIL/uL — ABNORMAL LOW (ref 4.22–5.81)
WBC: 25.4 10*3/uL — ABNORMAL HIGH (ref 4.0–10.5)

## 2012-01-17 LAB — BLOOD GAS, ARTERIAL
Drawn by: 31101
MECHVT: 500 mL
RATE: 20 resp/min
pCO2 arterial: 39.2 mmHg (ref 35.0–45.0)
pH, Arterial: 7.416 (ref 7.350–7.450)

## 2012-01-17 LAB — BASIC METABOLIC PANEL
BUN: 32 mg/dL — ABNORMAL HIGH (ref 6–23)
CO2: 24 mEq/L (ref 19–32)
Chloride: 99 mEq/L (ref 96–112)
Creatinine, Ser: 1.55 mg/dL — ABNORMAL HIGH (ref 0.50–1.35)

## 2012-01-17 MED ORDER — DIPHENHYDRAMINE HCL 50 MG/ML IJ SOLN
25.0000 mg | Freq: Four times a day (QID) | INTRAMUSCULAR | Status: DC | PRN
  Administered 2012-01-17: 25 mg via INTRAVENOUS
  Filled 2012-01-17: qty 1

## 2012-01-17 MED ORDER — FUROSEMIDE 10 MG/ML IJ SOLN
40.0000 mg | Freq: Once | INTRAMUSCULAR | Status: DC
  Filled 2012-01-17: qty 4

## 2012-01-17 MED ORDER — POTASSIUM CHLORIDE 20 MEQ/15ML (10%) PO LIQD
ORAL | Status: AC
  Administered 2012-01-17: 10:00:00
  Filled 2012-01-17: qty 15

## 2012-01-17 MED ORDER — INSULIN ASPART 100 UNIT/ML ~~LOC~~ SOLN
1.0000 [IU] | SUBCUTANEOUS | Status: DC
  Administered 2012-01-17: 3 [IU] via SUBCUTANEOUS
  Administered 2012-01-18 (×4): 1 [IU] via SUBCUTANEOUS
  Administered 2012-01-19: 3 [IU] via SUBCUTANEOUS
  Administered 2012-01-19 (×2): 4 [IU] via SUBCUTANEOUS
  Administered 2012-01-19: 3 [IU] via SUBCUTANEOUS

## 2012-01-17 MED ORDER — PANTOPRAZOLE SODIUM 40 MG PO PACK
40.0000 mg | PACK | Freq: Every day | ORAL | Status: DC
  Administered 2012-01-17 – 2012-01-26 (×10): 40 mg
  Filled 2012-01-17 (×12): qty 20

## 2012-01-17 MED ORDER — POTASSIUM CHLORIDE 20 MEQ/15ML (10%) PO LIQD
40.0000 meq | Freq: Once | ORAL | Status: DC
Start: 2012-01-17 — End: 2012-01-18
  Filled 2012-01-17: qty 30

## 2012-01-17 MED ORDER — CHLORHEXIDINE GLUCONATE CLOTH 2 % EX PADS
6.0000 | MEDICATED_PAD | Freq: Every day | CUTANEOUS | Status: AC
  Administered 2012-01-17 – 2012-01-21 (×5): 6 via TOPICAL

## 2012-01-17 MED ORDER — POTASSIUM PHOSPHATE DIBASIC 3 MMOLE/ML IV SOLN
20.0000 mmol | Freq: Once | INTRAVENOUS | Status: AC
  Administered 2012-01-17: 20 mmol via INTRAVENOUS
  Filled 2012-01-17: qty 6.67

## 2012-01-17 MED ORDER — INSULIN ASPART 100 UNIT/ML ~~LOC~~ SOLN: 1.0000 [IU] | SUBCUTANEOUS | Status: DC | PRN

## 2012-01-17 MED ORDER — ATROPINE SULFATE 1 MG/ML IJ SOLN
INTRAMUSCULAR | Status: AC
  Administered 2012-01-18: 1 mg
  Filled 2012-01-17: qty 2

## 2012-01-17 MED FILL — Lidocaine HCl IV Inj 20 MG/ML: INTRAVENOUS | Qty: 5 | Status: AC

## 2012-01-17 MED FILL — Etomidate IV Soln 2 MG/ML: INTRAVENOUS | Qty: 20 | Status: AC

## 2012-01-17 MED FILL — Succinylcholine Chloride Inj 20 MG/ML: INTRAMUSCULAR | Qty: 10 | Status: AC

## 2012-01-17 MED FILL — Rocuronium Bromide IV Soln 50 MG/5ML (10 MG/ML): INTRAVENOUS | Qty: 10 | Status: AC

## 2012-01-17 NOTE — Progress Notes (Signed)
SUBJECTIVE: Pt awake. He responds appropriately.   BP 163/67  Pulse 60  Temp(Src) 99.1 F (37.3 C) (Oral)  Resp 21  Ht 5\' 8"  (1.727 m)  Wt 193 lb 5.5 oz (87.7 kg)  BMI 29.40 kg/m2  SpO2 94%  Intake/Output Summary (Last 24 hours) at 01/17/12 0651 Last data filed at 01/17/12 0600  Gross per 24 hour  Intake 3867.2 ml  Output   1525 ml  Net 2342.2 ml    PHYSICAL EXAM General: Well developed, well nourished. Intubated. Awake, alert.  Psych:  Responds appropriately Neck:Mildly elevated JVD.  No masses noted.  Lungs: Clear anteriorly with no wheezes or rhonci noted.  Heart: RRR with no murmurs noted. Abdomen: Bowel sounds are present. Soft, non-tender.  Extremities: Trace bilateral  lower extremity edema.   LABS: Basic Metabolic Panel:  Basename 01/17/12 0415 01/16/12 0200  NA 133* 136  K 3.6 3.3*  CL 99 101  CO2 24 24  GLUCOSE 174* 203*  BUN 32* 19  CREATININE 1.55* 1.38*  CALCIUM 7.6* 7.7*  MG 2.4 1.6  PHOS 3.8 0.8*   CBC:  Basename 01/17/12 0415 01/16/12 0440 01/14/12 1747  WBC 25.4* 23.5* --  NEUTROABS -- -- 8.2*  HGB 11.7* 13.1 --  HCT 33.6* 38.5* --  MCV 86.4 87.9 --  PLT 253 289 --   Cardiac Enzymes:  Basename 01/16/12 2323 01/16/12 1523 01/16/12 0723  CKTOTAL 1696* 2750* 3591*  CKMB 116.0* 252.5* 424.7*  CKMBINDEX -- -- --  TROPONINI >25.00* >25.00* >25.00*     Current Meds:    . amitriptyline  25 mg Oral QHS  . antiseptic oral rinse  1 application Mouth Rinse QID  . aspirin  81 mg Oral Daily  . atorvastatin  80 mg Oral q1800  . chlorhexidine  15 mL Mouth/Throat BID  . Chlorhexidine Gluconate Cloth  6 each Topical Daily  . feeding supplement  30 mL Per Tube TID  . furosemide  40 mg Intravenous Once  . hydrocortisone sodium succinate  50 mg Intravenous Q6H  . levothyroxine  137 mcg Oral QAC breakfast  . magnesium sulfate 1 - 4 g bolus IVPB  2 g Intravenous Once  . mupirocin ointment  1 application Nasal BID  . pantoprazole  (PROTONIX) IV  40 mg Intravenous Q24H  . PARoxetine  20 mg Oral Daily  . potassium phosphate IVPB (mmol)  30 mmol Intravenous Once  . Ticagrelor  90 mg Oral BID  . DISCONTD: simvastatin  40 mg Oral q1800     ASSESSMENT AND PLAN:   1. Unstable angina/CAD: Pt admitted three days ago with unstable angina. His cardiac enzymes were negative. He is known to have severe vascular disease. He was found to have a 99% stenosis of the proximal LAD and a 99% ulcerated plaque in the proximal Circumflex. I placed a drug eluting stent in the proximal to mid LAD and a drug eluting stent in the proximal Circumflex. A small diagonal branch was jailed by the LAD stent. He was noted to have disease in the distal segment of the LAD where the vessel became small caliber. He did well following the first procedure but began to have severe substernal chest pain one hour after his cath. His EKG showed ST segment elevation in the anterior leads. He was brought back emergently to the cath lab for relook cath. His LAD was occluded proximally, felt to be secondary to stent thrombosis vs dissection proximally. I opened the LAD and placed a DES  in the proximal LAD extending back to the ostium. He went into cardiogenic shock with asystole and VT in the cath lab. He was resuscitated but required intubation for resp failure and vasopressors for hemodynamic support. I could not place an IABP because of his severe peripheral vascular disease. He has been stable overnight and his Levophed has been weaned but he is still requiring Levophed and Dopamine for hemodynamic support. His Plavix was stopped and he has been started on Brilinta. Integrilin was used initially after cath but he  oozed from his groin site and he has had nosebleeds so Integrilin was stopped. Nosebleeds have stopped. Echo yesterday with LVEF 50-55% and his anterior wall is moving.  -Continue ASA/Brilinta/statin.  -No beta blocker secondary to hypotension  -Will give 40 mg  IV Lasix again this am.  -Wean pressors as tolerated. We will attempt to get him off of Levophed today.   2. Acute respiratory failure: Appreciate PCCM assistance. I am hopeful that he can be extubated today.   3. Cardiogenic shock: Levophed is being weaned. Will continue to wean pressors as tolerated.   4. Acute renal insufficiency: Slight worsening in setting of cardiogenic shock.   5. Hypokalemia: KDur 40 meq per tube.   6. Dispo: Critically ill. Discussion with family at bedside. Continue CCU care. SCD's bilaterally. Nutrition per OG tube.    Marlie Kuennen  3/28/20136:51 AM

## 2012-01-17 NOTE — Progress Notes (Signed)
HPI:  66 y/o WM with PMH of CAD, HTN, AAA s/p stenting from aorta to femorals admitted 3/25 after episode of chest pain at rest. 3/26 underwent planned cardiac cath with stent to LAD. Post cath began having chest pain and returned to Cath Lab. LAD stent had re-occluded and underwent balloon angioplasty with stent to LAD. Course further complicated by respiratory distress. PCCM consulted for assistance.  Antibiotics:   None  Cultures/Sepsis Markers:   None  Access/Protocols:  3/26 OETT>>>  3/26 L Femoral Sheath Venous and Arterial>>>3/27 3/26 R IJ TLC>>> 3/27 L radial a-line>>>  Best Practice: DVT: Fully anticoag by cards GI: Protonix  Subjective: No events overnight, arousable.  Physical Exam: Filed Vitals:   01/17/12 0802  BP: 122/58  Pulse: 59  Temp:   Resp: 20    Intake/Output Summary (Last 24 hours) at 01/17/12 0846 Last data filed at 01/17/12 0600  Gross per 24 hour  Intake 3358.2 ml  Output   1275 ml  Net 2083.2 ml   Vent Mode:  [-] PRVC FiO2 (%):  [40 %-50 %] 50 % Set Rate:  [20 bmp] 20 bmp Vt Set:  [500 mL] 500 mL PEEP:  [5 cmH20] 5 cmH20 Plateau Pressure:  [18 cmH20-24 cmH20] 24 cmH20  Neuro: Sedated and intubated. Cardiac: RRR, Nl S1/S2, -M/R/G. Pulmonary: Diffuse crackles. GI: Soft, NT, ND and +BS. Extremities: 1+ edema.  Labs: CBC    Component Value Date/Time   WBC 25.4* 01/17/2012 0415   RBC 3.89* 01/17/2012 0415   HGB 11.7* 01/17/2012 0415   HCT 33.6* 01/17/2012 0415   PLT 253 01/17/2012 0415   MCV 86.4 01/17/2012 0415   MCH 30.1 01/17/2012 0415   MCHC 34.8 01/17/2012 0415   RDW 13.6 01/17/2012 0415   LYMPHSABS 1.5 01/14/2012 1747   MONOABS 0.5 01/14/2012 1747   EOSABS 0.4 01/14/2012 1747   BASOSABS 0.0 01/14/2012 1747   BMET    Component Value Date/Time   NA 133* 01/17/2012 0415   K 3.6 01/17/2012 0415   CL 99 01/17/2012 0415   CO2 24 01/17/2012 0415   GLUCOSE 174* 01/17/2012 0415   BUN 32* 01/17/2012 0415   CREATININE 1.55* 01/17/2012 0415   CALCIUM 7.6* 01/17/2012 0415   GFRNONAA 45* 01/17/2012 0415   GFRAA 52* 01/17/2012 0415   ABG    Component Value Date/Time   PHART 7.416 01/17/2012 0408   PCO2ART 39.2 01/17/2012 0408   PO2ART 54.9* 01/17/2012 0408   HCO3 24.8* 01/17/2012 0408   TCO2 26.0 01/17/2012 0408   ACIDBASEDEF 2.0 01/16/2012 0422   O2SAT 88.7 01/17/2012 0408    Lab 01/17/12 0415  MG 2.4   Lab Results  Component Value Date   CALCIUM 7.6* 01/17/2012   PHOS 3.8 01/17/2012   Chest Xray: Pulmonary edema, ET tube ok, no clear infiltrate.  Assessment & Plan: Acute Respiratory Failure  Assessment: secondary to acute cardiogenic pulmonary edema.  Plan   - F/U ABG.   - Full vent support, at current setting, SBT only once off pressors and diuresed.   - No diureses until shock resolves.   - ET tube ok position.  CAD / Chest Pain / Status-post CATH with stent to LAD and cardiogenic shock.  CVP 16-20  Assessment:  Plan:  - Per cards, fully anticoagulated.   - Shock: cardiogenic in nature, CVP 16-20.   - Levophed 6, will attempt to decrease levo before dopa.  - Dopamine at 15.  - Vasopressin only as a last resort   -  No IABP due to prior AAA surgery with multiple stents.   - Cortisol level 20.6, relative hypo. Stress dose steroids to be continued for now.  Renal: Hx of renal insufficiency, creat. Rising likely related to hypotension but UOP remains relatively stable.   GI: No active issues. Will continue TF.   Endocrine: no history of diabetes. Will monitor BS and start ISS.   ID: Leukocytosis likely due to steroids and stress, no evidence of infection, CXR is atelectasis and ET tube aspirate is clear without fever.  Family updated at length bedside.  The patient is critically ill with multiple organ systems failure and requires high complexity decision making for assessment and support, frequent evaluation and titration of therapies, application of advanced monitoring technologies and extensive interpretation of  multiple databases. Critical Care Time devoted to patient care services described in this note is 35 minutes.  [name], MD [phone]

## 2012-01-18 ENCOUNTER — Inpatient Hospital Stay (HOSPITAL_COMMUNITY): Payer: Medicare Other

## 2012-01-18 ENCOUNTER — Other Ambulatory Visit: Payer: Self-pay

## 2012-01-18 DIAGNOSIS — R001 Bradycardia, unspecified: Secondary | ICD-10-CM | POA: Diagnosis not present

## 2012-01-18 DIAGNOSIS — I498 Other specified cardiac arrhythmias: Secondary | ICD-10-CM

## 2012-01-18 LAB — BASIC METABOLIC PANEL
BUN: 29 mg/dL — ABNORMAL HIGH (ref 6–23)
Calcium: 7.8 mg/dL — ABNORMAL LOW (ref 8.4–10.5)
Calcium: 7.9 mg/dL — ABNORMAL LOW (ref 8.4–10.5)
Creatinine, Ser: 1.28 mg/dL (ref 0.50–1.35)
Creatinine, Ser: 1.15 mg/dL (ref 0.50–1.35)
GFR calc Af Amer: 66 mL/min — ABNORMAL LOW (ref >90)
GFR calc non Af Amer: 57 mL/min — ABNORMAL LOW (ref >90)
GFR calc non Af Amer: 65 mL/min — ABNORMAL LOW (ref >90)
Glucose, Bld: 144 mg/dL — ABNORMAL HIGH (ref 70–99)
Sodium: 137 mEq/L (ref 135–145)

## 2012-01-18 LAB — GLUCOSE, CAPILLARY
Glucose-Capillary: 136 mg/dL — ABNORMAL HIGH (ref 70–99)
Glucose-Capillary: 143 mg/dL — ABNORMAL HIGH (ref 70–99)
Glucose-Capillary: 154 mg/dL — ABNORMAL HIGH (ref 70–99)

## 2012-01-18 LAB — POCT I-STAT 3, ART BLOOD GAS (G3+)
Acid-Base Excess: 2 mmol/L (ref 0.0–2.0)
Patient temperature: 98.2
pH, Arterial: 7.404 (ref 7.350–7.450)

## 2012-01-18 LAB — BLOOD GAS, ARTERIAL
Drawn by: 35849
MECHVT: 500 mL
PEEP: 5 cmH2O
Patient temperature: 98.6
RATE: 20 resp/min
TCO2: 28.2 mmol/L (ref 0–100)
pCO2 arterial: 42.6 mmHg (ref 35.0–45.0)
pH, Arterial: 7.416 (ref 7.350–7.450)

## 2012-01-18 LAB — CBC
Hemoglobin: 11.7 g/dL — ABNORMAL LOW (ref 13.0–17.0)
MCH: 30 pg (ref 26.0–34.0)
MCHC: 34.5 g/dL (ref 30.0–36.0)
MCV: 86.9 fL (ref 78.0–100.0)
Platelets: 210 10*3/uL (ref 150–400)

## 2012-01-18 LAB — PHOSPHORUS: Phosphorus: 2.9 mg/dL (ref 2.3–4.6)

## 2012-01-18 MED ORDER — SODIUM CHLORIDE 0.9 % IV SOLN
INTRAVENOUS | Status: DC
  Administered 2012-01-18: 20:00:00 via INTRAVENOUS

## 2012-01-18 MED ORDER — LORAZEPAM 2 MG/ML IJ SOLN
2.0000 mg | INTRAMUSCULAR | Status: DC | PRN
  Administered 2012-01-18 – 2012-01-22 (×4): 2 mg via INTRAVENOUS
  Filled 2012-01-18 (×6): qty 1

## 2012-01-18 MED ORDER — SODIUM CHLORIDE 0.9 % IV SOLN
INTRAVENOUS | Status: DC
  Administered 2012-01-18: 10 mL/h via INTRAVENOUS

## 2012-01-18 MED ORDER — HEPARIN SODIUM (PORCINE) 5000 UNIT/ML IJ SOLN
5000.0000 [IU] | Freq: Three times a day (TID) | INTRAMUSCULAR | Status: DC
  Administered 2012-01-18 – 2012-01-27 (×28): 5000 [IU] via SUBCUTANEOUS
  Filled 2012-01-18 (×31): qty 1

## 2012-01-18 MED ORDER — SODIUM CHLORIDE 0.9 % IV SOLN: INTRAVENOUS | Status: DC

## 2012-01-18 MED ORDER — PROPOFOL 10 MG/ML IV EMUL
5.0000 ug/kg/min | INTRAVENOUS | Status: DC
  Administered 2012-01-18: 20 ug/kg/min via INTRAVENOUS
  Administered 2012-01-18: 25 ug/kg/min via INTRAVENOUS
  Administered 2012-01-19: 20 ug/kg/min via INTRAVENOUS
  Filled 2012-01-18 (×2): qty 100

## 2012-01-18 MED ORDER — TICAGRELOR 90 MG PO TABS
90.0000 mg | ORAL_TABLET | Freq: Two times a day (BID) | ORAL | Status: DC
  Administered 2012-01-18 – 2012-01-27 (×19): 90 mg
  Filled 2012-01-18 (×21): qty 1

## 2012-01-18 MED ORDER — PROPOFOL 10 MG/ML IV EMUL
INTRAVENOUS | Status: AC
  Administered 2012-01-18: 25 ug/kg/min via INTRAVENOUS
  Filled 2012-01-18: qty 100

## 2012-01-18 MED ORDER — ASPIRIN 81 MG PO CHEW
81.0000 mg | CHEWABLE_TABLET | Freq: Every day | ORAL | Status: DC
  Administered 2012-01-18 – 2012-01-26 (×9): 81 mg
  Filled 2012-01-18 (×10): qty 1

## 2012-01-18 MED ORDER — LEVOTHYROXINE SODIUM 137 MCG PO TABS
137.0000 ug | ORAL_TABLET | Freq: Every day | ORAL | Status: DC
  Administered 2012-01-18 – 2012-01-27 (×10): 137 ug
  Filled 2012-01-18 (×11): qty 1

## 2012-01-18 MED ORDER — FUROSEMIDE 10 MG/ML IJ SOLN
40.0000 mg | Freq: Once | INTRAMUSCULAR | Status: AC
  Administered 2012-01-18: 40 mg via INTRAVENOUS
  Filled 2012-01-18: qty 4

## 2012-01-18 MED ORDER — ATORVASTATIN CALCIUM 80 MG PO TABS
80.0000 mg | ORAL_TABLET | Freq: Every day | ORAL | Status: DC
  Administered 2012-01-18 – 2012-01-26 (×9): 80 mg
  Filled 2012-01-18 (×10): qty 1

## 2012-01-18 NOTE — Significant Event (Signed)
Pt noted to be difficult to achieve adequate sedation while on vent with versed.  Developed itching with opiate medications.  Will change to diprivan.  Coralyn Helling, MD 01/18/2012, 4:27 PM [phone]

## 2012-01-18 NOTE — Procedures (Signed)
Central Venous Catheter Insertion Procedure Note Nathan Mcgee [id number] Apr 13, 1946  Procedure: Insertion of Central Venous Catheter Indications: Drug and/or fluid administration  Procedure Details Consent: Risks of procedure as well as the alternatives and risks of each were explained to the (patient/caregiver).  Consent for procedure obtained. Time Out: Verified patient identification, verified procedure, site/side was marked, verified correct patient position, special equipment/implants available, medications/allergies/relevent history reviewed, required imaging and test results available.  {time out performed:yes  Maximum sterile technique was used including antiseptics, cap, gloves, gown, hand hygiene, mask and sheet. Skin prep: Chlorhexidine; local anesthetic administered A antimicrobial bonded/coated triple lumen catheter was placed in the left internal jugular vein using the Seldinger technique.  Evaluation Blood flow good Complications: No apparent complications Patient did tolerate procedure well. Chest X-ray ordered to verify placement.  CXR: pending.  Nathan Mcgee V. 01/18/2012, 5:09 PM

## 2012-01-18 NOTE — Progress Notes (Signed)
HPI:  66 y/o WM with PMH of CAD, HTN, AAA s/p stenting from aorta to femorals admitted 3/25 after episode of chest pain at rest. 3/26 underwent planned cardiac cath with stent to LAD. Post cath began having chest pain and returned to Cath Lab. LAD stent had re-occluded and underwent balloon angioplasty with stent to LAD. Course further complicated by respiratory distress. PCCM consulted for assistance.  Antibiotics:   None  Cultures/Sepsis Markers:   None  Access/Protocols:  3/26 OETT>>>  3/26 L Femoral Sheath Venous and Arterial>>>3/27 3/26 R IJ TLC>>> 3/27 L radial a-line>>>  Best Practice: DVT: Fully anticoag by cards GI: Protonix  Subjective: Episode of  bradycardia responsive to atropine overnight  Physical Exam: Filed Vitals:   01/18/12 0700  BP: 108/74  Pulse: 65  Temp:   Resp: 20    Intake/Output Summary (Last 24 hours) at 01/18/12 0738 Last data filed at 01/18/12 0600  Gross per 24 hour  Intake 2475.7 ml  Output   2825 ml  Net -349.3 ml   Vent Mode:  [-] PRVC FiO2 (%):  [50 %-60 %] 60 % Set Rate:  [20 bmp] 20 bmp Vt Set:  [500 mL] 500 mL PEEP:  [5 cmH20] 5 cmH20 Plateau Pressure:  [22 cmH20-30 cmH20] 22 cmH20  Neuro: Sedated and intubated. Cardiac: RRR, Nl S1/S2, -M/R/G. Pulmonary: Diffuse crackles. GI: Soft, NT, ND and +BS. Extremities: 1+ edema. Skin: Clear  Labs: CBC    Component Value Date/Time   WBC 15.2* 01/18/2012 0534   RBC 3.90* 01/18/2012 0534   HGB 11.7* 01/18/2012 0534   HCT 33.9* 01/18/2012 0534   PLT 210 01/18/2012 0534   MCV 86.9 01/18/2012 0534   MCH 30.0 01/18/2012 0534   MCHC 34.5 01/18/2012 0534   RDW 13.4 01/18/2012 0534   LYMPHSABS 1.5 01/14/2012 1747   MONOABS 0.5 01/14/2012 1747   EOSABS 0.4 01/14/2012 1747   BASOSABS 0.0 01/14/2012 1747   BMET    Component Value Date/Time   NA 137 01/18/2012 0534   K 3.8 01/18/2012 0534   CL 105 01/18/2012 0534   CO2 25 01/18/2012 0534   GLUCOSE 144* 01/18/2012 0534   BUN 28* 01/18/2012 0534   CREATININE 1.15 01/18/2012 0534   CALCIUM 7.9* 01/18/2012 0534   GFRNONAA 65* 01/18/2012 0534   GFRAA 75* 01/18/2012 0534   ABG    Component Value Date/Time   PHART 7.416 01/18/2012 0338   PCO2ART 42.6 01/18/2012 0338   PO2ART 79.1* 01/18/2012 0338   HCO3 26.9* 01/18/2012 0338   TCO2 28.2 01/18/2012 0338   ACIDBASEDEF 2.0 01/16/2012 0422   O2SAT 96.4 01/18/2012 0338    Lab 01/18/12 0534  MG 2.3   Lab Results  Component Value Date   CALCIUM 7.9* 01/18/2012   PHOS 2.9 01/18/2012   Chest Xray: Pulmonary edema improved, ET tube ok, no clear infiltrate.  Assessment & Plan: Acute Respiratory Failure  Assessment: secondary to acute cardiogenic pulmonary edema.  Plan   - F/U ABG.   - Full vent support, at current setting, titrate oxygen  - No diureses until shock resolves.   - ET tube ok position.  CAD / Chest Pain / Status-post CATH with stent to LAD and cardiogenic shock.  CVP 16-20  Assessment:  Plan:  - Per cards, fully anticoagulated.   - Shock: cardiogenic in nature, CVP 16-20.   - Levophed to continue  - Dopamine at 15.  - Vasopressin only as a last resort   - No IABP due  to prior AAA surgery with multiple stents.    Renal: Hx of renal insufficiency Creatinine is improved  Lab 01/18/12 0534 01/18/12 0030 01/17/12 0415 01/16/12 0200 01/15/12 2335  CREATININE 1.15 1.28 1.55* 1.38* 1.34     GI: No active issues. Will continue TF.   Endocrine: no history of diabetes. Will monitor BS and start SSI  ID: Leukocytosis likely due to steroids and stress, no evidence of infection, CXR is atelectasis and ET tube aspirate is clear without fever.  Note white count falling  Lab 01/18/12 0534 01/17/12 0415 01/16/12 0440  HGB 11.7* 11.7* 13.1  HCT 33.9* 33.6* 38.5*  WBC 15.2* 25.4* 23.5*  PLT 210 253 289    Family updated at length bedside.  The patient is critically ill with multiple organ systems failure and requires high complexity decision making for assessment and support,  frequent evaluation and titration of therapies, application of advanced monitoring technologies and extensive interpretation of multiple databases. Critical Care Time devoted to patient care services described in this note is 35 minutes.  Shan Levans, MD Beeper  [phone]  Cell  [phone]  If no response or cell goes to voicemail, call beeper [phone]

## 2012-01-18 NOTE — Progress Notes (Signed)
SUBJECTIVE: Pt intubated. One episode of bradycardia overnight when dopamine was weaned.   BP 108/74  Pulse 65  Temp(Src) 97.9 F (36.6 C) (Oral)  Resp 20  Ht 5\' 8"  (1.727 m)  Wt 192 lb 0.3 oz (87.1 kg)  BMI 29.20 kg/m2  SpO2 98%  Intake/Output Summary (Last 24 hours) at 01/18/12 8413 Last data filed at 01/18/12 0600  Gross per 24 hour  Intake 2475.7 ml  Output   2825 ml  Net -349.3 ml    PHYSICAL EXAM General: Well developed, well nourished. Intubated, sedated.  Neck: Mild JVD. No masses noted.  Lungs: Clear bilaterally with mechanical breath sounds with no wheezes or rhonci noted.  Heart: RRR with no murmurs noted. Abdomen: Bowel sounds are present. Soft, non-tender.  Extremities: Trace lower extremity edema.   LABS: Basic Metabolic Panel:  Basename 01/18/12 0534 01/18/12 0030 01/17/12 0415  NA 137 135 --  K 3.8 3.6 --  CL 105 100 --  CO2 25 25 --  GLUCOSE 144* 135* --  BUN 28* 29* --  CREATININE 1.15 1.28 --  CALCIUM 7.9* 7.8* --  MG 2.3 -- 2.4  PHOS 2.9 -- 3.8   CBC:  Basename 01/18/12 0534 01/17/12 0415  WBC 15.2* 25.4*  NEUTROABS -- --  HGB 11.7* 11.7*  HCT 33.9* 33.6*  MCV 86.9 86.4  PLT 210 253   Cardiac Enzymes:  Basename 01/16/12 2323 01/16/12 1523 01/16/12 0723  CKTOTAL 1696* 2750* 3591*  CKMB 116.0* 252.5* 424.7*  CKMBINDEX -- -- --  TROPONINI >25.00* >25.00* >25.00*    Current Meds:    . antiseptic oral rinse  1 application Mouth Rinse QID  . aspirin  81 mg Per Tube Daily  . atorvastatin  80 mg Per Tube q1800  . atropine      . chlorhexidine  15 mL Mouth/Throat BID  . Chlorhexidine Gluconate Cloth  6 each Topical Daily  . feeding supplement  30 mL Per Tube TID  . insulin aspart  1-4 Units Subcutaneous Q4H  . levothyroxine  137 mcg Per Tube QAC breakfast  . mupirocin ointment  1 application Nasal BID  . pantoprazole sodium  40 mg Per Tube QHS  . potassium chloride      . potassium phosphate IVPB (mmol)  20 mmol Intravenous  Once  . Ticagrelor  90 mg Per Tube BID  . DISCONTD: amitriptyline  25 mg Oral QHS  . DISCONTD: aspirin  81 mg Oral Daily  . DISCONTD: atorvastatin  80 mg Oral q1800  . DISCONTD: furosemide  40 mg Intravenous Once  . DISCONTD: levothyroxine  137 mcg Oral QAC breakfast  . DISCONTD: pantoprazole (PROTONIX) IV  40 mg Intravenous Q24H  . DISCONTD: PARoxetine  20 mg Oral Daily  . DISCONTD: potassium chloride  40 mEq Oral Once  . DISCONTD: Ticagrelor  90 mg Oral BID     ASSESSMENT AND PLAN:  1. Unstable angina/CAD: Pt admitted 01/14/12 with unstable angina. His cardiac enzymes were negative. He is known to have severe vascular disease. He was found to have a 99% stenosis of the proximal LAD and a 99% ulcerated plaque in the proximal Circumflex. I placed a drug eluting stent in the proximal to mid LAD and a drug eluting stent in the proximal Circumflex. A small diagonal branch was jailed by the LAD stent. He was noted to have disease in the distal segment of the LAD where the vessel became small caliber. He did well following the first procedure but  began to have severe substernal chest pain one hour after his cath. His EKG showed ST segment elevation in the anterior leads. He was brought back emergently to the cath lab for relook cath. His LAD was occluded proximally, felt to be secondary to stent thrombosis vs dissection proximally. I opened the LAD and placed a DES in the proximal LAD extending back to the ostium. He went into cardiogenic shock with asystole and VT in the cath lab. He was resuscitated but required intubation for resp failure and vasopressors for hemodynamic support. I could not place an IABP because of his severe peripheral vascular disease. He has been stable overnight and his Levophed has been weaned but he is still requiring Levophed and Dopamine for hemodynamic support. His Plavix was stopped and he has been started on Brilinta. Integrilin was used initially after cath but he oozed  from his groin site and he has had nosebleeds so Integrilin was stopped. Nosebleeds have stopped. Echo with LVEF 50-55% and his anterior wall is moving. Levophed is off.  -Continue ASA/Brilinta/statin.  -No beta blocker secondary to hypotension  -Wean dopamine as tolerated.   2. Bradycardia: Strips reviewed. Appears to have been junctional brady. ? Related to coughing with vagal response or may be from dopamine weaning. Will follow.   3. Acute respiratory failure: Appreciate PCCM assistance. Vent management per PCCM. Pulm edema on CXR. Will give 40 mg IV Lasix today (no diuretics yesterday)  4. Cardiogenic shock: Levophed is off. Will continue to wean dopamine as tolerated.   5. Acute renal insufficiency: Resolved.  6. Hypokalemia: Resolved.  7. Dispo: Critically ill. Discussion with family at bedside. Continue CCU care. SCD's bilaterally. Will add Heparin SQ for DVT prophylaxis now that he has had no further nosebleeds off of Integrilin.  Nutrition per OG tube.       Cyriah Childrey  3/29/20137:38 AM

## 2012-01-18 NOTE — Progress Notes (Signed)
[facility] PCCM Progress / Critical Care Cross Cover Note  I was called to the bedside STAT by nursing because Mr. Olander had a bradycardic event with hypotension this evening.  Upon my arrival his HR was normal and his BP was elevated after he had received atropine x1 per [facility].    On exam   Neuro: He was following commands and resting comfortably PULM: CTA B CV: RRR, no mgr AB: soft, nontender Ext: trace edema, warm  STAT EKG: NSR, ST wave changes in anterior and lateral precordial leads similar to 3/27 study.  ABG: 7.404/42.6/68  Review of Tele during brady event: ? Junctional bradycardia ? I cannot see clear p-waves during the event   Updated problem list:  1) Transient bradycardia: unclear etiology, I question a temporary junctional bradycardia; ddx includes vagal event, hypoxemia/mucus plug but nothing in history tonight supports, appears that it was spontaneous. ABG essentially unchanged -don't wean dopamine for now -stat BMET to look for electrolyte abnormality -discussed with Cardiology fellow who will review telemetry  I updated the patient's daughter at bedside (she was quite upset as she witnessed the event).    Total CC time involved 45 minutes.  Yolonda Kida PCCM Pager: [phone] If no response, call [phone]

## 2012-01-18 NOTE — Progress Notes (Signed)
Nutrition Follow-up  Diet Order:  NPO Osmolite 1.2 and Pro-stat TID  via OG tube.  Remains on vent, sedated. OG tube feedings, at goal rate of 60 ml/hr, provides 1944 kcal and 125 gm protein. No residuals documented. One episode of bradycardia overnight. 1+ edema in extremities. Starting SSI for increased BS, pt with no hx of DM.    Meds: Scheduled Meds:   . antiseptic oral rinse  1 application Mouth Rinse QID  . aspirin  81 mg Per Tube Daily  . atorvastatin  80 mg Per Tube q1800  . atropine      . chlorhexidine  15 mL Mouth/Throat BID  . Chlorhexidine Gluconate Cloth  6 each Topical Daily  . feeding supplement  30 mL Per Tube TID  . furosemide  40 mg Intravenous Once  . heparin subcutaneous  5,000 Units Subcutaneous Q8H  . insulin aspart  1-4 Units Subcutaneous Q4H  . levothyroxine  137 mcg Per Tube QAC breakfast  . mupirocin ointment  1 application Nasal BID  . pantoprazole sodium  40 mg Per Tube QHS  . potassium chloride      . potassium phosphate IVPB (mmol)  20 mmol Intravenous Once  . Ticagrelor  90 mg Per Tube BID  . DISCONTD: amitriptyline  25 mg Oral QHS  . DISCONTD: aspirin  81 mg Oral Daily  . DISCONTD: atorvastatin  80 mg Oral q1800  . DISCONTD: levothyroxine  137 mcg Oral QAC breakfast  . DISCONTD: PARoxetine  20 mg Oral Daily  . DISCONTD: potassium chloride  40 mEq Oral Once  . DISCONTD: Ticagrelor  90 mg Oral BID   Continuous Infusions:   . sodium chloride 10 mL/hr at 01/17/12 1823  . amiodarone (NEXTERONE PREMIX) 360 mg/200 mL dextrose    . DOPamine 17 mcg/kg/min (01/18/12 0222)  . feeding supplement (OSMOLITE 1.2 CAL) 1,000 mL (01/16/12 2142)  . midazolam (VERSED) infusion 4 mg/hr (01/18/12 0528)  . norepinephrine (LEVOPHED) Adult infusion Stopped (01/17/12 1700)  . DISCONTD: fentaNYL infusion INTRAVENOUS 75 mcg/kg/hr (01/17/12 1700)   PRN Meds:.acetaminophen, alum & mag hydroxide-simeth, diphenhydrAMINE, nitroGLYCERIN, ondansetron (ZOFRAN) IV, DISCONTD:  insulin aspart  Labs:  CMP     Component Value Date/Time   NA 137 01/18/2012 0534   K 3.8 01/18/2012 0534   CL 105 01/18/2012 0534   CO2 25 01/18/2012 0534   GLUCOSE 144* 01/18/2012 0534   BUN 28* 01/18/2012 0534   CREATININE 1.15 01/18/2012 0534   CALCIUM 7.9* 01/18/2012 0534   GFRNONAA 65* 01/18/2012 0534   GFRAA 75* 01/18/2012 0534  Magnesium and Phosphorous WNL    Intake/Output Summary (Last 24 hours) at 01/18/12 1007 Last data filed at 01/18/12 0900  Gross per 24 hour  Intake 2536.1 ml  Output   2925 ml  Net -388.9 ml    Weight Status:  192 lbs, weight trending down for the last 2 days, still above admission weight  Re-estimated needs:  1900 kcal, 120-140 gm protein  Nutrition Dx:  Inadequate oral intake, ongoing  Goal:  TF to meet >90% of estimated nutrition needs, met   Intervention:   1. No nutrition interventions at this time, continue with current TF rate and scheduled Pro-stat 2. RD will continue to follow  Monitor:  TF rate/tolerance, Weight, labs, I/O's   Rudean Haskell Pager #:  [phone]

## 2012-01-19 ENCOUNTER — Inpatient Hospital Stay (HOSPITAL_COMMUNITY): Payer: Medicare Other

## 2012-01-19 LAB — CBC
Hemoglobin: 11.8 g/dL — ABNORMAL LOW (ref 13.0–17.0)
MCV: 87.8 fL (ref 78.0–100.0)
Platelets: 256 10*3/uL (ref 150–400)
RBC: 3.94 MIL/uL — ABNORMAL LOW (ref 4.22–5.81)
WBC: 11.7 10*3/uL — ABNORMAL HIGH (ref 4.0–10.5)

## 2012-01-19 LAB — GLUCOSE, CAPILLARY
Glucose-Capillary: 161 mg/dL — ABNORMAL HIGH (ref 70–99)
Glucose-Capillary: 122 mg/dL — ABNORMAL HIGH (ref 70–99)
Glucose-Capillary: 215 mg/dL — ABNORMAL HIGH (ref 70–99)
Glucose-Capillary: 151 mg/dL — ABNORMAL HIGH (ref 70–99)

## 2012-01-19 LAB — COMPREHENSIVE METABOLIC PANEL
Albumin: 2.5 g/dL — ABNORMAL LOW (ref 3.5–5.2)
BUN: 28 mg/dL — ABNORMAL HIGH (ref 6–23)
Calcium: 8.2 mg/dL — ABNORMAL LOW (ref 8.4–10.5)
Chloride: 103 mEq/L (ref 96–112)
Creatinine, Ser: 1.2 mg/dL (ref 0.50–1.35)
Total Bilirubin: 0.4 mg/dL (ref 0.3–1.2)

## 2012-01-19 LAB — DIFFERENTIAL
Eosinophils Relative: 3 % (ref 0–5)
Lymphocytes Relative: 7 % — ABNORMAL LOW (ref 12–46)
Lymphs Abs: 0.8 10*3/uL (ref 0.7–4.0)
Neutrophils Relative %: 83 % — ABNORMAL HIGH (ref 43–77)

## 2012-01-19 MED ORDER — FENTANYL CITRATE 0.05 MG/ML IJ SOLN
100.0000 ug | Freq: Once | INTRAMUSCULAR | Status: AC
  Administered 2012-01-19: 100 ug via INTRAVENOUS
  Filled 2012-01-19: qty 2

## 2012-01-19 MED ORDER — SODIUM CHLORIDE 0.9 % IV SOLN
10.0000 ug/h | INTRAVENOUS | Status: DC
  Administered 2012-01-19 – 2012-01-21 (×7): 50 ug/h via INTRAVENOUS
  Filled 2012-01-19 (×2): qty 50

## 2012-01-19 MED ORDER — LORAZEPAM 2 MG/ML IJ SOLN: 2.0000 mg | INTRAMUSCULAR | Status: DC

## 2012-01-19 MED ORDER — VANCOMYCIN HCL IN DEXTROSE 1-5 GM/200ML-% IV SOLN
1000.0000 mg | Freq: Two times a day (BID) | INTRAVENOUS | Status: DC
  Administered 2012-01-19 – 2012-01-24 (×10): 1000 mg via INTRAVENOUS
  Filled 2012-01-19 (×13): qty 200

## 2012-01-19 MED ORDER — QUETIAPINE FUMARATE 50 MG PO TABS
50.0000 mg | ORAL_TABLET | Freq: Two times a day (BID) | ORAL | Status: DC
  Administered 2012-01-19 – 2012-01-22 (×7): 50 mg via ORAL
  Filled 2012-01-19 (×9): qty 1

## 2012-01-19 MED ORDER — MIDAZOLAM HCL 2 MG/2ML IJ SOLN
8.0000 mg | Freq: Once | INTRAMUSCULAR | Status: AC
  Administered 2012-01-19: 8 mg via INTRAVENOUS

## 2012-01-19 MED ORDER — PIPERACILLIN-TAZOBACTAM 3.375 G IVPB
3.3750 g | Freq: Three times a day (TID) | INTRAVENOUS | Status: DC
  Administered 2012-01-19 – 2012-01-24 (×15): 3.375 g via INTRAVENOUS
  Filled 2012-01-19 (×16): qty 50

## 2012-01-19 MED ORDER — FUROSEMIDE 10 MG/ML IJ SOLN
40.0000 mg | Freq: Once | INTRAMUSCULAR | Status: AC
  Administered 2012-01-19: 40 mg via INTRAVENOUS
  Filled 2012-01-19: qty 4

## 2012-01-19 MED ORDER — FENTANYL CITRATE 0.05 MG/ML IJ SOLN
25.0000 ug | INTRAMUSCULAR | Status: DC | PRN
  Administered 2012-01-19: 100 ug via INTRAVENOUS
  Administered 2012-01-23 (×2): 50 ug via INTRAVENOUS
  Filled 2012-01-19 (×4): qty 2

## 2012-01-19 MED ORDER — AMITRIPTYLINE HCL 50 MG PO TABS
50.0000 mg | ORAL_TABLET | Freq: Every day | ORAL | Status: DC
  Administered 2012-01-19 – 2012-01-26 (×8): 50 mg via ORAL
  Filled 2012-01-19 (×9): qty 1

## 2012-01-19 MED ORDER — PAROXETINE HCL 20 MG PO TABS
20.0000 mg | ORAL_TABLET | Freq: Every day | ORAL | Status: DC
  Administered 2012-01-19 – 2012-01-27 (×9): 20 mg
  Filled 2012-01-19 (×9): qty 1

## 2012-01-19 MED ORDER — POTASSIUM CHLORIDE 20 MEQ/15ML (10%) PO LIQD
ORAL | Status: AC
  Filled 2012-01-19: qty 30

## 2012-01-19 MED ORDER — POTASSIUM CHLORIDE 20 MEQ/15ML (10%) PO LIQD
40.0000 meq | Freq: Once | ORAL | Status: AC
  Administered 2012-01-19: 40 meq
  Filled 2012-01-19: qty 30

## 2012-01-19 MED ORDER — SODIUM CHLORIDE 0.9 % IV SOLN
1.0000 mg/h | INTRAVENOUS | Status: DC
  Administered 2012-01-19: 4 mg/h via INTRAVENOUS
  Administered 2012-01-19: 3 mg/h via INTRAVENOUS
  Administered 2012-01-20: 4 mg/h via INTRAVENOUS
  Administered 2012-01-20 – 2012-01-21 (×2): 2 mg/h via INTRAVENOUS
  Filled 2012-01-19 (×4): qty 10

## 2012-01-19 NOTE — Procedures (Signed)
Bronchoscopy Procedure Note  Date of Operation: 01/19/2012  Pre-op Diagnosis: tracheobronchitis  Post-op Diagnosis: same  Surgeon: Shan Levans  Anesthesia: Monitored Local Anesthesia with Sedation  Operation: Flexible fiberoptic bronchoscopy, diagnostic   Findings: diffuse tracheobronchitis, no endobronch lesion   Specimen: RLL bronchial wash  Estimated Blood Loss: none  Complications: none   Indications and History: The patient is a 66 y.o. male with resp failure, cardiogenic shock .  The risks, benefits, complications, treatment options and expected outcomes were discussed with the patient's family.  The possibilities of reaction to medication, pulmonary aspiration, perforation of a viscus, bleeding, failure to diagnose a condition and creating a complication requiring transfusion or operation were discussed with the patient who freely signed the consent.    Description of Procedure: The patient was re-examined in the bronchoscopy suite and the site of surgery properly noted/marked.  The patient was identified as Nathan Mcgee and the procedure verified as Flexible Fiberoptic Bronchoscopy.  A Time Out was held and the above information confirmed.   After the induction of topical nasopharyngeal anesthesia, the patient was positioned  and the bronchoscope was passed through the ETT tube. The vocal cords were visualized and  1% buffered lidocaine 5 ml was topically placed onto the cords. The scope was then passed into the trachea.   Careful inspection of the tracheal lumen was accomplished. The scope was sequentially passed into the left main and then left upper and lower bronchi and segmental bronchi.   The scope was then withdrawn and advanced into the right main bronchus and then into the RUL, RML, and RLL bronchi and segmental bronchi.   Bronchial washing  was done and there was one  specimen.   Endobronchial findings: diffuse tracheobronchitis, no endobronch lesion  Trachea:  Edematous mucosa Carina: Edematous mucosa Right main bronchus: Edematous mucosa Right upper lobe bronchus: Edematous mucosa Right upper lobe bronchus: Edematous mucosa Right upper lobe bronchus: Edematous mucosa Left main bronchus: Edematous mucosa Left upper lobe bronchus: Edematous mucosa Left lower lobe bronchus: Edematous mucosa  The Patient was taken to the Endoscopy Recovery area in satisfactory condition.  Attestation: I performed the procedure.  Shan Levans

## 2012-01-19 NOTE — Plan of Care (Signed)
Problem: Phase I Progression Outcomes Goal: Voiding-avoid urinary catheter unless indicated Outcome: Not Applicable Date Met:  01/19/12 Pt. sedated

## 2012-01-19 NOTE — Progress Notes (Signed)
eLink Physician-Brief Progress Note Patient Name: Nathan Mcgee DOB: 1946-04-23 MRN: [id number]  Date of Service  01/19/2012   HPI/Events of Note  Agitation remains an issue - pulled out lines on 3/29 Doubt true allergy to fentanyl - itching reported in past, has tolerated pushes ok   eICU Interventions  Start fent drip Seroquel 50 bid & titrate to effect ,monitor qT 430 now   Intervention Category Minor Interventions: Agitation / anxiety - evaluation and management  Nathan Mcgee V. 01/19/2012, 5:19 PM

## 2012-01-19 NOTE — Progress Notes (Signed)
Pt. Has had intermittent periods of extreme agitation, reaching to pull ETT even while on Versed.  He is impulsive and hard to control, even w/sitter.  Increasing Versed to dropped BP to 90's even while on Dopamine.  Spoke w/Dr. Vassie Loll who changed dopamine out to levophed, added Fentanyl drip and Seroquel via tube.  Reinitiated bilat. Wrist restraints as pt. Is very strong during episodes of agitation and takes 3-4 people to hold his arms and legs down.

## 2012-01-19 NOTE — Progress Notes (Signed)
HPI:  66 y/o WM with PMH of CAD, HTN, AAA s/p stenting from aorta to femorals admitted 3/25 after episode of chest pain at rest. 3/26 underwent planned cardiac cath with stent to LAD. Post cath began having chest pain and returned to Cath Lab. LAD stent had re-occluded and underwent balloon angioplasty with stent to LAD. Course further complicated by respiratory distress. PCCM consulted for assistance.  Antibiotics:   None  Cultures/Sepsis Markers:   None  Access/Protocols:  3/26 OETT>>>  3/26 L Femoral Sheath Venous and Arterial>>>3/27 3/26 R IJ TLC>>>3/29 3/29 L IJ TLC>>> 3/27 L radial a-line>>>  Best Practice: DVT: Hep SQ GI: Protonix  Subjective:  Pulled out CVL ,  Agitation at times.  Responds to ativan. Hx of PTSD  Physical Exam: Filed Vitals:   01/19/12 0600  BP: 112/75  Pulse:   Temp:   Resp:     Intake/Output Summary (Last 24 hours) at 01/19/12 0750 Last data filed at 01/19/12 0600  Gross per 24 hour  Intake 2793.7 ml  Output   3850 ml  Net -1056.3 ml   Vent Mode:  [-] PRVC FiO2 (%):  [50 %] 50 % Set Rate:  [20 bmp] 20 bmp Vt Set:  [500 mL] 500 mL PEEP:  [5 cmH20] 5 cmH20 Plateau Pressure:  [14 cmH20-21 cmH20] 14 cmH20  Neuro: agitated  Cardiac: RRR, Nl S1/S2, -M/R/G. Pulmonary: Diffuse crackles. GI: Soft, NT, ND and +BS. Extremities: 1+ edema. Skin: Clear  Labs: CBC    Component Value Date/Time   WBC 11.7* 01/19/2012 0415   RBC 3.94* 01/19/2012 0415   HGB 11.8* 01/19/2012 0415   HCT 34.6* 01/19/2012 0415   PLT 256 01/19/2012 0415   MCV 87.8 01/19/2012 0415   MCH 29.9 01/19/2012 0415   MCHC 34.1 01/19/2012 0415   RDW 13.3 01/19/2012 0415   LYMPHSABS 0.8 01/19/2012 0415   MONOABS 0.8 01/19/2012 0415   EOSABS 0.3 01/19/2012 0415   BASOSABS 0.0 01/19/2012 0415   BMET    Component Value Date/Time   NA 139 01/19/2012 0415   K 3.2* 01/19/2012 0415   CL 103 01/19/2012 0415   CO2 27 01/19/2012 0415   GLUCOSE 148* 01/19/2012 0415   BUN 28* 01/19/2012 0415   CREATININE 1.20 01/19/2012 0415   CALCIUM 8.2* 01/19/2012 0415   GFRNONAA 61* 01/19/2012 0415   GFRAA 71* 01/19/2012 0415   ABG    Component Value Date/Time   PHART 7.416 01/18/2012 0338   PCO2ART 42.6 01/18/2012 0338   PO2ART 79.1* 01/18/2012 0338   HCO3 26.9* 01/18/2012 0338   TCO2 28.2 01/18/2012 0338   ACIDBASEDEF 2.0 01/16/2012 0422   O2SAT 96.4 01/18/2012 0338    Lab 01/18/12 0534  MG 2.3   Lab Results  Component Value Date   CALCIUM 8.2* 01/19/2012   PHOS 2.9 01/18/2012   Chest Xray: Pulmonary edema improved, ET tube ok, no clear infiltrate.  Assessment & Plan: Acute Respiratory Failure  Assessment: secondary to acute cardiogenic pulmonary edema. Purulent secretions Plan   - F/U ABG.   - Full vent support, at current setting, titrate oxygen  - one dose of lasix  - ET tube ok position.  -FOB today, send for c/s   Start zosyn IV and vanco IV   CAD / Chest Pain / Status-post CATH with stent to LAD and cardiogenic shock.  CVP 16-20  Assessment:  Plan:  - Per cards, fully anticoagulated.   - Shock: cardiogenic in nature,  - Dopamine to continue  -  No IABP due to prior AAA surgery with multiple stents.    Renal: Hx of renal insufficiency Creatinine is plateaued  Lab 01/19/12 0415 01/18/12 0534 01/18/12 0030 01/17/12 0415 01/16/12 0200  CREATININE 1.20 1.15 1.28 1.55* 1.38*     GI: No active issues. Will continue TF.   Endocrine: no history of diabetes. Will monitor BS and start SSI  WG:[id number] secretions.  WBC is lower  Lab 01/19/12 0415 01/18/12 0534 01/17/12 0415  HGB 11.8* 11.7* 11.7*  HCT 34.6* 33.9* 33.6*  WBC 11.7* 15.2* 25.4*  PLT 256 210 253  start zosyn/vanco Fob today  Family updated at length bedside.  The patient is critically ill with multiple organ systems failure and requires high complexity decision making for assessment and support, frequent evaluation and titration of therapies, application of advanced monitoring technologies and extensive  interpretation of multiple databases. Critical Care Time devoted to patient care services described in this note is 35 minutes.  Shan Levans, MD Beeper  [phone]  Cell  [phone]  If no response or cell goes to voicemail, call beeper [phone]

## 2012-01-19 NOTE — Progress Notes (Signed)
ANTIBIOTIC CONSULT NOTE - INITIAL  Pharmacy Consult for Vancomycin/Zosyn Indication: rule out pneumonia  Allergies  Allergen Reactions  . Codeine Itching    Patient Measurements: Height: 5\' 8"  (172.7 cm) Weight: 182 lb 5.1 oz (82.7 kg) IBW/kg (Calculated) : 68.4    Vital Signs: Temp: 98.1 F (36.7 C) (03/30 0700) Temp src: Oral (03/30 0700) BP: 111/71 mmHg (03/30 0700) Pulse Rate: 78  (03/30 0700) Intake/Output from previous day: 03/29 0701 - 03/30 0700 In: 2793.7 [I.V.:1264.7; NG/GT:1525; IV Piggyback:4] Out: 3850 [Urine:3850] Intake/Output from this shift:    Labs:  Basename 01/19/12 0415 01/18/12 0534 01/18/12 0030 01/17/12 0415  WBC 11.7* 15.2* -- 25.4*  HGB 11.8* 11.7* -- 11.7*  PLT 256 210 -- 253  LABCREA -- -- -- --  CREATININE 1.20 1.15 1.28 --   Estimated Creatinine Clearance: 63.5 ml/min (by C-G formula based on Cr of 1.2). No results found for this basename: VANCOTROUGH:2,VANCOPEAK:2,VANCORANDOM:2,GENTTROUGH:2,GENTPEAK:2,GENTRANDOM:2,TOBRATROUGH:2,TOBRAPEAK:2,TOBRARND:2,AMIKACINPEAK:2,AMIKACINTROU:2,AMIKACIN:2, in the last 72 hours   Microbiology: Recent Results (from the past 720 hour(s))  MRSA PCR SCREENING     Status: Abnormal   Collection Time   01/14/12  9:17 PM      Component Value Range Status Comment   MRSA by PCR POSITIVE (*) NEGATIVE  Final     Medical History: Past Medical History  Diagnosis Date  . Coronary artery disease   . Hypertension   . AAA (abdominal aortic aneurysm)     Medications:  Scheduled:    . amitriptyline  50 mg Oral QHS  . antiseptic oral rinse  1 application Mouth Rinse QID  . aspirin  81 mg Per Tube Daily  . atorvastatin  80 mg Per Tube q1800  . chlorhexidine  15 mL Mouth/Throat BID  . Chlorhexidine Gluconate Cloth  6 each Topical Daily  . feeding supplement  30 mL Per Tube TID  . furosemide  40 mg Intravenous Once  . furosemide  40 mg Intravenous Once  . heparin subcutaneous  5,000 Units Subcutaneous Q8H    . insulin aspart  1-4 Units Subcutaneous Q4H  . levothyroxine  137 mcg Per Tube QAC breakfast  . LORazepam  2 mg Intravenous Q4H  . mupirocin ointment  1 application Nasal BID  . pantoprazole sodium  40 mg Per Tube QHS  . PARoxetine  20 mg Per Tube Daily  . potassium chloride  40 mEq Per Tube Once  . potassium chloride      . Ticagrelor  90 mg Per Tube BID   Infusions:    . sodium chloride 10 mL/hr at 01/18/12 2000  . sodium chloride 10 mL/hr at 01/18/12 2000  . amiodarone (NEXTERONE PREMIX) 360 mg/200 mL dextrose    . DOPamine 20 mcg/kg/min (01/19/12 0759)  . feeding supplement (OSMOLITE 1.2 CAL) 1,000 mL (01/18/12 1750)  . midazolam (VERSED) infusion 4 mg/hr (01/19/12 0823)  . norepinephrine (LEVOPHED) Adult infusion Stopped (01/17/12 1700)  . DISCONTD: sodium chloride 10 mL/hr at 01/17/12 1823  . DISCONTD: sodium chloride 20 mL/hr (01/18/12 0700)  . DISCONTD: sodium chloride 10 mL/hr (01/18/12 1800)  . DISCONTD: midazolam (VERSED) infusion 4 mg/hr (01/18/12 0528)  . DISCONTD: propofol 20 mcg/kg/min (01/19/12 0746)   Anti-infectives    None     Assessment: 66 y/o M admitted 3/25 after episode of chest pain. Cardiac cath 3/26 w/ LAD stent. CXR shows pulmonary edema improved w/ no clear infiltrate but purulent secretions noted. Pharmacy to start Vancomycin/Zosyn for empiric coverage of suspected PNA. WBC decreasing at 11.7 from 15.2. Pt  currently afebrile (tmax 99.1). Renal fx is stable: Scr 1.20 est crcl ~65 ml/min.  Goal of Therapy:  Vancomycin trough level 15-20 mcg/ml  Plan:  1) Start Vancomycin 1000 mg IV q12 hrs 2) Start Zosyn 3.375 gm IV q8hrs (4 hr infusion) 3) Check Vancomycin trough level at steady state (if therapy to be continued >7days) 4) f/u WBC trend, LOT, cxs  Janace Litten, PharmD 01/19/2012,8:24 AM

## 2012-01-19 NOTE — Progress Notes (Signed)
Subjective:  Sedate, intubated. Agitated yesterday Bradycardic with decreased dopamine Increased thick secretions Objective:  Vital Signs in the last 24 hours: Temp:  [97.2 F (36.2 C)-99.1 F (37.3 C)] 98.1 F (36.7 C) (03/30 0700) Pulse Rate:  [55-86] 78  (03/30 0700) Resp:  [14-25] 20  (03/30 0700) BP: (94-148)/(12-100) 111/71 mmHg (03/30 0700) SpO2:  [95 %-100 %] 99 % (03/30 0700) Arterial Line BP: (108-145)/(50-59) 129/52 mmHg (03/29 1600) FiO2 (%):  [49.8 %-50 %] 49.8 % (03/30 0700) Weight:  [82.7 kg (182 lb 5.1 oz)] 82.7 kg (182 lb 5.1 oz) (03/30 0600)  Intake/Output from previous day: 03/29 0701 - 03/30 0700 In: 2793.7 [I.V.:1264.7; NG/GT:1525; IV Piggyback:4] Out: 3850 [Urine:3850] Intake/Output from this shift:    Physical Exam:  GEN: sedate on vent CV: RRR, no murmurs Lungs: mechanical noise,few scattered rhonci Abd: Soft +BS, NT Ext: trace edema, U and L edema Gu: Foley.  Neuro: sedate HEENT: left IJ central line Skin: warm dry  Lab Results:  Basename 01/19/12 0415 01/18/12 0534  WBC 11.7* 15.2*  HGB 11.8* 11.7*  PLT 256 210    Basename 01/19/12 0415 01/18/12 0534  NA 139 137  K 3.2* 3.8  CL 103 105  CO2 27 25  GLUCOSE 148* 144*  BUN 28* 28*  CREATININE 1.20 1.15    Basename 01/16/12 2323 01/16/12 1523  TROPONINI >25.00* >25.00*   Hepatic Function Panel  Basename 01/19/12 0415  PROT 6.0  ALBUMIN 2.5*  AST 45*  ALT 41  ALKPHOS 55  BILITOT 0.4  BILIDIR --  IBILI --    Cardiac Studies: ECHO  - Left ventricle: Severe LVH with spade like small LV cavity. Cannot R/O apical hypertroph. Distal septum and apex seem hypokinetic. The cavity size was normal. Wall thickness was increased in a pattern of severe LVH. Systolic function was normal. The estimated ejection fraction was in the range of 50% to 55%. - Atrial septum: No defect or patent foramen ovale was identified.  Dopamine 20.  Assessment/Plan:     Cardiogenic shock  -  Still on Dopamine . Difficult to wean, especially with bradycardia. Challenging.    Acute MI  - Dr. Weldon Picking note reviewed "Pt admitted 01/14/12 with unstable angina. His cardiac enzymes were negative. He is known to have severe vascular disease. He was found to have a 99% stenosis of the proximal LAD and a 99% ulcerated plaque in the proximal Circumflex. Drug eluting stent in the proximal to mid LAD and a drug eluting stent in the proximal Circumflex. A small diagonal branch was jailed by the LAD stent. He was noted to have disease in the distal segment of the LAD where the vessel became small caliber. He did well following the first procedure but began to have severe substernal chest pain one hour after his cath. His EKG showed ST segment elevation in the anterior leads. He was brought back emergently to the cath lab for relook cath. His LAD was occluded proximally, felt to be secondary to stent thrombosis vs dissection proximally.Opened the LAD and placed a DES in the proximal LAD extending back to the ostium. He went into cardiogenic shock with asystole and VT in the cath lab. He was resuscitated but required intubation for resp failure and vasopressors for hemodynamic support. Could not place an IABP because of his severe peripheral vascular disease. Dopamine for hemodynamic support. His Plavix was stopped and he has been started on Brilinta. Integrilin was used initially after cath but he oozed from his groin  site and he has had nosebleeds so Integrilin was stopped. Nosebleeds have stopped. Echo with LVEF 50-55% and his anterior wall is moving. Levophed is off."   Bradycardia  - perhaps a vagal component with thick mucous secretions. Dr. Delford Field performing bronchoscopy now.    Acute respiratory failure with hypoxia  - per CCM  - bronchoscopy  Remains critically ill following stent occlusion and requires complex medical care.   LOS: 5 days    Nathan Mcgee 01/19/2012, 8:55 AM

## 2012-01-20 ENCOUNTER — Inpatient Hospital Stay (HOSPITAL_COMMUNITY): Payer: Medicare Other

## 2012-01-20 LAB — GLUCOSE, CAPILLARY
Glucose-Capillary: 179 mg/dL — ABNORMAL HIGH (ref 70–99)
Glucose-Capillary: 214 mg/dL — ABNORMAL HIGH (ref 70–99)
Glucose-Capillary: 217 mg/dL — ABNORMAL HIGH (ref 70–99)
Glucose-Capillary: 112 mg/dL — ABNORMAL HIGH (ref 70–99)
Glucose-Capillary: 175 mg/dL — ABNORMAL HIGH (ref 70–99)

## 2012-01-20 LAB — CBC
Hemoglobin: 11.1 g/dL — ABNORMAL LOW (ref 13.0–17.0)
MCHC: 34.2 g/dL (ref 30.0–36.0)
RDW: 13.6 % (ref 11.5–15.5)
WBC: 9.8 10*3/uL (ref 4.0–10.5)

## 2012-01-20 LAB — BASIC METABOLIC PANEL
BUN: 30 mg/dL — ABNORMAL HIGH (ref 6–23)
Chloride: 100 mEq/L (ref 96–112)
Creatinine, Ser: 1.31 mg/dL (ref 0.50–1.35)
GFR calc Af Amer: 64 mL/min — ABNORMAL LOW (ref >90)
GFR calc non Af Amer: 55 mL/min — ABNORMAL LOW (ref >90)
Potassium: 3.4 mEq/L — ABNORMAL LOW (ref 3.5–5.1)

## 2012-01-20 LAB — DIFFERENTIAL
Basophils Absolute: 0.1 10*3/uL (ref 0.0–0.1)
Basophils Relative: 1 % (ref 0–1)
Lymphocytes Relative: 9 % — ABNORMAL LOW (ref 12–46)
Neutro Abs: 7.4 10*3/uL (ref 1.7–7.7)
Neutrophils Relative %: 76 % (ref 43–77)

## 2012-01-20 MED ORDER — NOREPINEPHRINE BITARTRATE 1 MG/ML IJ SOLN
2.0000 ug/min | INTRAVENOUS | Status: DC
  Administered 2012-01-20: 17.5 ug/min via INTRAVENOUS
  Administered 2012-01-20: 10 ug/min via INTRAVENOUS
  Administered 2012-01-21: 7.5 ug/min via INTRAVENOUS
  Administered 2012-01-21: 17.5 ug/min via INTRAVENOUS
  Filled 2012-01-20 (×5): qty 8

## 2012-01-20 MED ORDER — FUROSEMIDE 10 MG/ML IJ SOLN
40.0000 mg | Freq: Once | INTRAMUSCULAR | Status: AC
  Administered 2012-01-20: 40 mg via INTRAVENOUS
  Filled 2012-01-20: qty 4

## 2012-01-20 MED ORDER — SODIUM CHLORIDE 0.9 % IV SOLN
INTRAVENOUS | Status: DC
  Administered 2012-01-20: 15 mL via INTRAVENOUS
  Administered 2012-01-21: 10 mL via INTRAVENOUS
  Administered 2012-01-23 (×2): 20 mL/h via INTRAVENOUS
  Administered 2012-01-23: 17:00:00 via INTRAVENOUS

## 2012-01-20 MED ORDER — INSULIN ASPART 100 UNIT/ML ~~LOC~~ SOLN
0.0000 [IU] | SUBCUTANEOUS | Status: DC
  Administered 2012-01-20 (×4): 4 [IU] via SUBCUTANEOUS
  Administered 2012-01-20: 7 [IU] via SUBCUTANEOUS
  Administered 2012-01-21: 3 [IU] via SUBCUTANEOUS
  Administered 2012-01-21 (×2): 4 [IU] via SUBCUTANEOUS
  Administered 2012-01-21: 3 [IU] via SUBCUTANEOUS
  Administered 2012-01-21 – 2012-01-22 (×4): 4 [IU] via SUBCUTANEOUS
  Administered 2012-01-22: 3 [IU] via SUBCUTANEOUS
  Administered 2012-01-22: 4 [IU] via SUBCUTANEOUS

## 2012-01-20 NOTE — Progress Notes (Signed)
Nathan Mcgee Subjective:  Sedate Bronchoscopy yesterday - mild mucous plugging Dopamine off, Levaphed on HR stable - had bradycardia prior when trying to wean dopamine  Objective:  Vital Signs in the last 24 hours: Temp:  [97.3 F (36.3 C)-99.5 F (37.5 C)] 97.3 F (36.3 C) (03/31 0800) Pulse Rate:  [51-86] 55  (03/31 0900) Resp:  [8-28] 20  (03/31 0900) BP: (55-173)/(37-95) 111/64 mmHg (03/31 0900) SpO2:  [94 %-100 %] 99 % (03/31 0900) FiO2 (%):  [39.5 %-40.5 %] 40.1 % (03/31 0900) Weight:  [84 kg (185 lb 3 oz)] 84 kg (185 lb 3 oz) (03/31 0630)  Intake/Output from previous day: 03/30 0701 - 03/31 0700 In: 4142.2 [I.V.:2008.2; NG/GT:1580; IV Piggyback:554] Out: 4510 [Urine:4510]   Physical Exam: GEN: sedate on vent  CV: RRR, no murmurs  Lungs: mechanical noise,few scattered rhonci  Abd: Soft +BS, NT  Ext: trace edema, U and L edema  Gu: Foley.  Neuro: sedate  HEENT: left IJ central line  Skin: warm dry    Lab Results:  Basename 01/20/12 0500 01/19/12 0415  WBC 9.8 11.7*  HGB 11.1* 11.8*  PLT 281 256    Basename 01/20/12 0500 01/19/12 0415  NA 137 139  K 3.4* 3.2*  CL 100 103  CO2 24 27  GLUCOSE 121* 148*  BUN 30* 28*  CREATININE 1.31 1.20    Hepatic Function Panel  Basename 01/19/12 0415  PROT 6.0  ALBUMIN 2.5*  AST 45*  ALT 41  ALKPHOS 55  BILITOT 0.4  BILIDIR --  IBILI --   Imaging: Dg Chest Port 1 View  01/20/2012  *RADIOLOGY REPORT*  Clinical Data: Intubated.  PORTABLE CHEST - 1 VIEW  Comparison:   the previous day's study  Findings: Endotracheal tube, nasogastric tube, and left IJ central line remain in place.  Heart size upper limits normal for technique.  Mild patchy subsegmental atelectasis or infiltrates in the lung bases without convincing change.  No definite effusion.  IMPRESSION:  Stable appearance since previous day's portable exam.  Original Report Authenticated By: Osa Craver, M.D.   Dg Chest Port 1 View  01/19/2012   *RADIOLOGY REPORT*  Clinical Data: Intubated  PORTABLE CHEST - 1 VIEW  Comparison:   the previous day's study  Findings: Endotracheal tube, nasogastric tube, and left IJ central line are stable in position.  The right IJ catheter has been removed.  Mild cardiomegaly.  Low lung volumes without focal infiltrate or overt edema.  No definite effusion.  IMPRESSION:  1. Support hardware projects in expected location.  Original Report Authenticated By: Osa Craver, M.D.   Dg Chest Port 1 View  01/18/2012  *RADIOLOGY REPORT*  Clinical Data: Line placement.  PORTABLE CHEST - 1 VIEW  Comparison: 01/18/2012  Findings: Left central line has been placed.  The tip is at the confluence of the innominate veins.  Right central line has been retracted into the right innominate vein.  Endotracheal tube and NG tube are unchanged.  There is cardiomegaly with vascular congestion.  Bibasilar atelectasis.  No effusions.  IMPRESSION: Cardiomegaly, vascular congestion.  Left central line tip at the confluence of the innominate veins. Right central line has been retracted into the right innominate vein.  No pneumothorax.  Original Report Authenticated By: Cyndie Chime, M.D.   Personally viewed.   Telemetry: No VT, HR 50-60 SR, no pauses Personally viewed.    Cardiac Studies: ECHO   - Left ventricle: Severe LVH with spade like small LV  cavity. Cannot R/O apical hypertroph. Distal septum and apex seem hypokinetic. The cavity size was normal. Wall thickness was increased in a pattern of severe LVH. Systolic function was normal. The estimated ejection fraction was in the range of 50% to 55%. - Atrial septum: No defect or patent foramen ovale was identified.     Assessment/Plan:  66 year old with ant STEMI, acute stent thrombosis, CPR, prior AAA repair.   Shock  - On Levophed, trying to wean.   - CVP 16-20  - No IABP - AAA repair   Bradycardia  - stable. Hopefully will remain stable as Levophed  decreased. May have been a vagal component.  CAD  - anitcoagulated. DAPT from Plavix to Brilinta. EF overall 50-55%. DES now in LAD (ostium). CPR in cath lab. Prior ST elevation ant leads.    - Atorvaatatin 80  Acute respiratory failure  - CCM  - Bronch - thick secretions.   - ABX  Critically ill. Appreciate CCM assistance.   Nathan Mcgee 01/20/2012, 10:20 AM

## 2012-01-20 NOTE — Progress Notes (Signed)
HPI:  66 y/o WM with PMH of CAD, HTN, AAA s/p stenting from aorta to femorals admitted 3/25 after episode of chest pain at rest. 3/26 underwent planned cardiac cath with stent to LAD. Post cath began having chest pain and returned to Cath Lab. LAD stent had re-occluded and underwent balloon angioplasty with stent to LAD. Course further complicated by respiratory distress. PCCM consulted for assistance.  Antibiotics:   None  Cultures/Sepsis Markers:   None  Access/Protocols:  3/26 OETT>>>  3/26 L Femoral Sheath Venous and Arterial>>>3/27 3/26 R IJ TLC>>>3/29 3/29 L IJ TLC>>> 3/27 L radial a-line>>>3/29  Best Practice: DVT: Hep SQ GI: Protonix  Subjective:  Less agitated, diuresed well.  Not ready to extubate d/t still needing vasopressors  Physical Exam: Filed Vitals:   01/20/12 0642  BP: 129/78  Pulse: 56  Temp:   Resp: 20    Intake/Output Summary (Last 24 hours) at 01/20/12 0803 Last data filed at 01/20/12 0600  Gross per 24 hour  Intake 3606.88 ml  Output   4510 ml  Net -903.12 ml   Vent Mode:  [-] PRVC FiO2 (%):  [39.5 %-49.7 %] 40.1 % Set Rate:  [20 bmp] 20 bmp Vt Set:  [500 mL] 500 mL PEEP:  [5 cmH20-5.2 cmH20] 5 cmH20 Plateau Pressure:  [15 cmH20-18 cmH20] 18 cmH20  Neuro: agitated  Cardiac: RRR, Nl S1/S2, -M/R/G. Pulmonary: clearer, less secretions. GI: Soft, NT, ND and +BS. Extremities: 1+ edema. Skin: Clear  Labs: CBC    Component Value Date/Time   WBC 9.8 01/20/2012 0500   RBC 3.71* 01/20/2012 0500   HGB 11.1* 01/20/2012 0500   HCT 32.5* 01/20/2012 0500   PLT 281 01/20/2012 0500   MCV 87.6 01/20/2012 0500   MCH 29.9 01/20/2012 0500   MCHC 34.2 01/20/2012 0500   RDW 13.6 01/20/2012 0500   LYMPHSABS 0.9 01/20/2012 0500   MONOABS 0.8 01/20/2012 0500   EOSABS 0.7 01/20/2012 0500   BASOSABS 0.1 01/20/2012 0500   BMET    Component Value Date/Time   NA 137 01/20/2012 0500   K 3.4* 01/20/2012 0500   CL 100 01/20/2012 0500   CO2 24 01/20/2012 0500   GLUCOSE  121* 01/20/2012 0500   BUN 30* 01/20/2012 0500   CREATININE 1.31 01/20/2012 0500   CALCIUM 7.9* 01/20/2012 0500   GFRNONAA 55* 01/20/2012 0500   GFRAA 64* 01/20/2012 0500   ABG    Component Value Date/Time   PHART 7.416 01/18/2012 0338   PCO2ART 42.6 01/18/2012 0338   PO2ART 79.1* 01/18/2012 0338   HCO3 26.9* 01/18/2012 0338   TCO2 28.2 01/18/2012 0338   ACIDBASEDEF 2.0 01/16/2012 0422   O2SAT 96.4 01/18/2012 0338    Lab 01/18/12 0534  MG 2.3   Lab Results  Component Value Date   CALCIUM 7.9* 01/20/2012   PHOS 2.9 01/18/2012   Chest Xray: Pulmonary edema resolved, ET tube ok, no clear infiltrate.  Assessment & Plan: Acute Respiratory Failure  Assessment: secondary to acute cardiogenic pulmonary edema. Purulent secretions Plan   -F/u Fob cultures  -Cont full vent  -Diurese further   CAD / Chest Pain / Status-post CATH with stent to LAD and cardiogenic shock.  CVP 16-20  Assessment:  Plan:  - Per cards, fully anticoagulated.   - Shock: cardiogenic in nature,  - levophed  to continue  - No IABP due to prior AAA surgery with multiple stents.    Renal: Hx of renal insufficiency Creatinine is plateaued  Lab 01/20/12 0500  01/19/12 0415 01/18/12 0534 01/18/12 0030 01/17/12 0415  CREATININE 1.31 1.20 1.15 1.28 1.55*     GI: No active issues. Will continue TF.   Endocrine: no history of diabetes. Will monitor BS and cont SSI  [id number] secretions.  WBC is lower   Lab 01/20/12 0500 01/19/12 0415 01/18/12 0534  HGB 11.1* 11.8* 11.7*  HCT 32.5* 34.6* 33.9*  WBC 9.8 11.7* 15.2*  PLT 281 256 210  cont zosyn/vanco F/u Fob cultures  Family updated at length bedside.  The patient is critically ill with multiple organ systems failure and requires high complexity decision making for assessment and support, frequent evaluation and titration of therapies, application of advanced monitoring technologies and extensive interpretation of multiple databases. Critical Care Time devoted to  patient care services described in this note is 35 minutes.  Shan Levans, MD Beeper  [phone]  Cell  [phone]  If no response or cell goes to voicemail, call beeper [phone]

## 2012-01-20 NOTE — Progress Notes (Signed)
Pt pulled OGT, very agitated and pulling at all tube, lines. Attempted to put new OGT down. pt very combative even with versed and fent bolus. Mouth bloody after attempt. Dr Delford Field aware OK to place [facility]

## 2012-01-21 ENCOUNTER — Inpatient Hospital Stay (HOSPITAL_COMMUNITY): Payer: Medicare Other

## 2012-01-21 LAB — COMPREHENSIVE METABOLIC PANEL
Albumin: 2.2 g/dL — ABNORMAL LOW (ref 3.5–5.2)
BUN: 22 mg/dL (ref 6–23)
Calcium: 7.6 mg/dL — ABNORMAL LOW (ref 8.4–10.5)
Creatinine, Ser: 1.29 mg/dL (ref 0.50–1.35)
GFR calc Af Amer: 65 mL/min — ABNORMAL LOW (ref >90)
Glucose, Bld: 185 mg/dL — ABNORMAL HIGH (ref 70–99)
Potassium: 3 mEq/L — ABNORMAL LOW (ref 3.5–5.1)
Total Protein: 5.4 g/dL — ABNORMAL LOW (ref 6.0–8.3)

## 2012-01-21 LAB — CBC
HCT: 33 % — ABNORMAL LOW (ref 39.0–52.0)
Hemoglobin: 11.1 g/dL — ABNORMAL LOW (ref 13.0–17.0)
MCV: 87.8 fL (ref 78.0–100.0)
RBC: 3.76 MIL/uL — ABNORMAL LOW (ref 4.22–5.81)
RDW: 13.6 % (ref 11.5–15.5)
WBC: 10.9 10*3/uL — ABNORMAL HIGH (ref 4.0–10.5)

## 2012-01-21 LAB — BASIC METABOLIC PANEL
CO2: 27 mEq/L (ref 19–32)
Calcium: 8.3 mg/dL — ABNORMAL LOW (ref 8.4–10.5)
Chloride: 101 mEq/L (ref 96–112)
Creatinine, Ser: 1.31 mg/dL (ref 0.50–1.35)
GFR calc Af Amer: 64 mL/min — ABNORMAL LOW (ref >90)
Sodium: 137 mEq/L (ref 135–145)

## 2012-01-21 LAB — GLUCOSE, CAPILLARY
Glucose-Capillary: 169 mg/dL — ABNORMAL HIGH (ref 70–99)
Glucose-Capillary: 187 mg/dL — ABNORMAL HIGH (ref 70–99)
Glucose-Capillary: 146 mg/dL — ABNORMAL HIGH (ref 70–99)
Glucose-Capillary: 183 mg/dL — ABNORMAL HIGH (ref 70–99)
Glucose-Capillary: 172 mg/dL — ABNORMAL HIGH (ref 70–99)
Glucose-Capillary: 132 mg/dL — ABNORMAL HIGH (ref 70–99)

## 2012-01-21 LAB — CULTURE, BAL-QUANTITATIVE W GRAM STAIN

## 2012-01-21 MED ORDER — POTASSIUM CHLORIDE 10 MEQ/50ML IV SOLN
INTRAVENOUS | Status: AC
  Administered 2012-01-21: 10 meq via INTRAVENOUS
  Filled 2012-01-21: qty 100

## 2012-01-21 MED ORDER — K PHOS MONO-SOD PHOS DI & MONO 155-852-130 MG PO TABS
500.0000 mg | ORAL_TABLET | Freq: Two times a day (BID) | ORAL | Status: AC
  Administered 2012-01-21 (×2): 500 mg via ORAL
  Filled 2012-01-21 (×3): qty 2

## 2012-01-21 MED ORDER — POTASSIUM CHLORIDE 10 MEQ/100ML IV SOLN: 10.0000 meq | INTRAVENOUS | Status: DC

## 2012-01-21 MED ORDER — POTASSIUM CHLORIDE 10 MEQ/50ML IV SOLN
10.0000 meq | INTRAVENOUS | Status: AC
  Administered 2012-01-21 (×4): 10 meq via INTRAVENOUS

## 2012-01-21 NOTE — Progress Notes (Signed)
SUBJECTIVE: Pt remains intubated. He is alert this am. No events overnight.   BP 112/81  Pulse 76  Temp(Src) 98.8 F (37.1 C) (Oral)  Resp 24  Ht 5\' 8"  (1.727 m)  Wt 185 lb 10 oz (84.2 kg)  BMI 28.22 kg/m2  SpO2 99%  Intake/Output Summary (Last 24 hours) at 01/21/12 0700 Last data filed at 01/21/12 0535  Gross per 24 hour  Intake 3334.31 ml  Output   2650 ml  Net 684.31 ml    PHYSICAL EXAM General: Sedated on vent.  Neck: No JVD. No masses noted.  Lungs: Mechanical breath sounds bilaterally.  Heart: RRR with no murmurs noted. Abdomen: Bowel sounds are present. Soft, non-tender.  Extremities: No lower extremity edema.   LABS: Basic Metabolic Panel:  Basename 01/21/12 0415 01/20/12 0500  NA 138 137  K 3.0* 3.4*  CL 99 100  CO2 27 24  GLUCOSE 185* 121*  BUN 22 30*  CREATININE 1.29 1.31  CALCIUM 7.6* 7.9*  MG -- --  PHOS -- --   CBC:  Basename 01/21/12 0415 01/20/12 0500 01/19/12 0415  WBC 10.9* 9.8 --  NEUTROABS -- 7.4 9.7*  HGB 11.1* 11.1* --  HCT 33.0* 32.5* --  MCV 87.8 87.6 --  PLT 292 281 --   Current Meds:    . amitriptyline  50 mg Oral QHS  . antiseptic oral rinse  1 application Mouth Rinse QID  . aspirin  81 mg Per Tube Daily  . atorvastatin  80 mg Per Tube q1800  . chlorhexidine  15 mL Mouth/Throat BID  . Chlorhexidine Gluconate Cloth  6 each Topical Daily  . feeding supplement  30 mL Per Tube TID  . furosemide  40 mg Intravenous Once  . heparin subcutaneous  5,000 Units Subcutaneous Q8H  . insulin aspart  0-20 Units Subcutaneous Q4H  . levothyroxine  137 mcg Per Tube QAC breakfast  . pantoprazole sodium  40 mg Per Tube QHS  . PARoxetine  20 mg Per Tube Daily  . piperacillin-tazobactam (ZOSYN)  IV  3.375 g Intravenous Q8H  . potassium chloride  10 mEq Intravenous Q1 Hr x 4  . potassium chloride      . QUEtiapine  50 mg Oral BID  . Ticagrelor  90 mg Per Tube BID  . vancomycin  1,000 mg Intravenous Q12H     ASSESSMENT AND  PLAN:  1. Unstable angina/CAD: Pt admitted 01/14/12 with unstable angina. His cardiac enzymes were negative. He is known to have severe vascular disease. He was found to have a 99% stenosis of the proximal LAD and a 99% ulcerated plaque in the proximal Circumflex. I placed a drug eluting stent in the proximal to mid LAD and a drug eluting stent in the proximal Circumflex. A small diagonal branch was jailed by the LAD stent. He was noted to have disease in the distal segment of the LAD where the vessel became small caliber. He did well following the first procedure but began to have severe substernal chest pain one hour after his cath. His EKG showed ST segment elevation in the anterior leads. He was brought back emergently to the cath lab for relook cath. His LAD was occluded proximally, felt to be secondary to stent thrombosis vs dissection proximally. I opened the LAD and placed a DES in the proximal LAD extending back to the ostium. He went into cardiogenic shock with asystole and VT in the cath lab. He was resuscitated but required intubation for resp  failure and vasopressors for hemodynamic support. I could not place an IABP because of his severe peripheral vascular disease. His Plavix was stopped and he has been started on Brilinta. Integrilin was used initially after cath but he oozed from his groin site and he has had nosebleeds so Integrilin was stopped.  Echo with LVEF 50-55% and his anterior wall is moving.  -Continue ASA/Brilinta/statin.  -No beta blocker secondary to hypotension  -Wean Levophed as tolerated.   2. Bradycardia: Pt had several episodes of bradycardia last week. He has remained in sinus brady.    3. Acute respiratory failure: Appreciate PCCM assistance. Vent management per PCCM. Antibiotics. S/p bronchoscopy over the weekend with thick secretions. ? Lasix today for pulm edema. Will leave up to PCCM.  4. Cardiogenic shock: Continues to require Levophed for support.   5. Acute  renal insufficiency: Resolved. Creatinine is 1.29 this am.   6. Hypokalemia: Potassium replaced this am.   7. Dispo: Critically ill. Discussion with family at bedside. Continue CCU care. Heparin SQ for DVT prophylaxis. Nutrition per OG tube.     Nathan Mcgee  4/1/20137:00 AM

## 2012-01-21 NOTE — Procedures (Deleted)
Extubation Procedure Note  Patient Details:   Name: HOLTEN SPANO DOB: 1946-01-24 MRN: [id number]   Airway Documentation:     Evaluation  O2 sats: stable throughout Complications: No apparent complications Patient did tolerate procedure well. Bilateral Breath Sounds: Clear;Diminished Suctioning: Airway Yes  Pt extubated and placed on 4LNC. HR 85 95% RR-10 115/73. No stridor noted.   Adolm Joseph 01/21/2012, 9:16 AM

## 2012-01-21 NOTE — Progress Notes (Signed)
eLink Physician-Brief Progress Note Patient Name: Nathan Mcgee DOB: 1945-12-04 MRN: [id number]  Date of Service  01/21/2012   HPI/Events of Note     eICU Interventions  Hypokalemia, repleted    Intervention Category Intermediate Interventions: Electrolyte abnormality - evaluation and management  Amal Renbarger 01/21/2012, 6:10 AM

## 2012-01-21 NOTE — Progress Notes (Signed)
HPI:  66 y/o WM with PMH of CAD, HTN, AAA s/p stenting from aorta to femorals admitted 3/25 after episode of chest pain at rest. 3/26 underwent planned cardiac cath with stent to LAD. Post cath began having chest pain and returned to Cath Lab. LAD stent had re-occluded and underwent balloon angioplasty with stent to LAD. Course further complicated by respiratory distress. PCCM consulted for assistance.  Antibiotics:   Vancomycin 3/30>>> Zosyn 3/30>>>  Cultures/Sepsis Markers:   BAL 3/30>>>GNRs  Access/Protocols:  3/26 OETT>>>  3/26 L Femoral Sheath Venous and Arterial>>>3/27 3/26 R IJ TLC>>>3/29 3/29 L IJ TLC>>> 3/27 L radial a-line>>>3/29  Best Practice: DVT: Hep SQ GI: Protonix  Subjective:  Less agitated, diuresed well.  Not ready to extubate d/t still needing vasopressors  Physical Exam: Filed Vitals:   01/21/12 0720  BP: 103/68  Pulse: 72  Temp:   Resp:     Intake/Output Summary (Last 24 hours) at 01/21/12 0815 Last data filed at 01/21/12 0535  Gross per 24 hour  Intake 3199.81 ml  Output   2475 ml  Net 724.81 ml   Vent Mode:  [-] PRVC FiO2 (%):  [39.5 %-40.3 %] 40 % Set Rate:  [20 bmp] 20 bmp Vt Set:  [500 mL] 500 mL PEEP:  [4.6 cmH20-5.5 cmH20] 5 cmH20 Plateau Pressure:  [18 cmH20-20 cmH20] 18 cmH20  Neuro: agitated  Cardiac: RRR, Nl S1/S2, -M/R/G. Pulmonary: clearer, less secretions. GI: Soft, NT, ND and +BS. Extremities: 1+ edema. Skin: Clear  Labs: CBC    Component Value Date/Time   WBC 10.9* 01/21/2012 0415   RBC 3.76* 01/21/2012 0415   HGB 11.1* 01/21/2012 0415   HCT 33.0* 01/21/2012 0415   PLT 292 01/21/2012 0415   MCV 87.8 01/21/2012 0415   MCH 29.5 01/21/2012 0415   MCHC 33.6 01/21/2012 0415   RDW 13.6 01/21/2012 0415   LYMPHSABS 0.9 01/20/2012 0500   MONOABS 0.8 01/20/2012 0500   EOSABS 0.7 01/20/2012 0500   BASOSABS 0.1 01/20/2012 0500   BMET    Component Value Date/Time   NA 138 01/21/2012 0415   K 3.0* 01/21/2012 0415   CL 99 01/21/2012 0415   CO2 27  01/21/2012 0415   GLUCOSE 185* 01/21/2012 0415   BUN 22 01/21/2012 0415   CREATININE 1.29 01/21/2012 0415   CALCIUM 7.6* 01/21/2012 0415   GFRNONAA 56* 01/21/2012 0415   GFRAA 65* 01/21/2012 0415   ABG    Component Value Date/Time   PHART 7.416 01/18/2012 0338   PCO2ART 42.6 01/18/2012 0338   PO2ART 79.1* 01/18/2012 0338   HCO3 26.9* 01/18/2012 0338   TCO2 28.2 01/18/2012 0338   ACIDBASEDEF 2.0 01/16/2012 0422   O2SAT 96.4 01/18/2012 0338    Lab 01/18/12 0534  MG 2.3   Lab Results  Component Value Date   CALCIUM 7.6* 01/21/2012   PHOS 2.9 01/18/2012   Chest Xray: Pulmonary edema resolved, ET tube ok, no clear infiltrate.  Assessment & Plan: Acute Respiratory Failure  Assessment: secondary to acute cardiogenic pulmonary edema. Purulent secretions Plan   -F/u Fob cultures GNRs so far.  -Begin PS trials.  -Hold diureses for today.  CAD / Chest Pain / Status-post CATH with stent to LAD and cardiogenic shock.  CVP 16-20  Assessment:  Plan:  - Per cards, SQ hep.   - Shock: ?septic component.  - Levophed  to continue for MAP 65 mmHg.  - No IABP due to prior AAA surgery with multiple stents.    Renal:  Hx of renal insufficiency Creatinine is plateaued  Lab 01/21/12 0415 01/20/12 0500 01/19/12 0415 01/18/12 0534 01/18/12 0030  CREATININE 1.29 1.31 1.20 1.15 1.28  Replace K.  GI: No active issues. Will continue TF.   Endocrine: no history of diabetes. Will monitor BS and cont SSI  KG:MWNUUVOZ secretions.  WBC is lower   Lab 01/21/12 0415 01/20/12 0500 01/19/12 0415  HGB 11.1* 11.1* 11.8*  HCT 33.0* 32.5* 34.6*  WBC 10.9* 9.8 11.7*  PLT 292 281 256  cont zosyn/vanco F/u Fob cultures  Wife updated bedside, ? Of tracheostomy was asked and family is to think about it but informed that doubt will need it.  The patient is critically ill with multiple organ systems failure and requires high complexity decision making for assessment and support, frequent evaluation and titration of therapies,  application of advanced monitoring technologies and extensive interpretation of multiple databases. Critical Care Time devoted to patient care services described in this note is 35 minutes.  [name], MD [phone]

## 2012-01-21 NOTE — Progress Notes (Signed)
   CARE MANAGEMENT NOTE 01/21/2012  Patient:  DEMONTA, WOMBLES   Account Number:  [id number]  Date Initiated:  01/21/2012  Documentation initiated by:  GRAVES-BIGELOW,Nayelly Laughman  Subjective/Objective Assessment:   Pt admitted with PMH of CAD, HTN, AAA s/p stenting from aorta to femorals admitted 3/25 after episode of chest pain at rest. 3/26 underwent planned cath with stent to LAD. Pt intubated. Antibiotics. S/p bronchoscopy ovr the weekend.     Action/Plan:   Continues to require Levophed for support. Nutrition per OG tube. Question tracheostomy- family to think about. Pt is from home with wife.   Anticipated DC Date:  02/01/2012   Anticipated DC Plan:  LONG TERM ACUTE CARE (LTAC)      DC Planning Services  CM consult      Choice offered to / List presented to:             Status of service:  In process, will continue to follow Medicare Important Message given?   (If response is "NO", the following Medicare IM given date fields will be blank) Date Medicare IM given:   Date Additional Medicare IM given:    Discharge Disposition:    Per UR Regulation:    If discussed at Long Length of Stay Meetings, dates discussed:    Comments:  01-21-12 1152 Tomi Bamberger, RN,BSN [phone] CM will continue to monitor for d/c disposition needs.

## 2012-01-22 ENCOUNTER — Inpatient Hospital Stay (HOSPITAL_COMMUNITY): Payer: Medicare Other

## 2012-01-22 LAB — BASIC METABOLIC PANEL
Calcium: 8.2 mg/dL — ABNORMAL LOW (ref 8.4–10.5)
GFR calc Af Amer: 62 mL/min — ABNORMAL LOW (ref >90)
GFR calc non Af Amer: 54 mL/min — ABNORMAL LOW (ref >90)
Glucose, Bld: 147 mg/dL — ABNORMAL HIGH (ref 70–99)
Potassium: 3.9 mEq/L (ref 3.5–5.1)
Sodium: 138 mEq/L (ref 135–145)

## 2012-01-22 LAB — POCT I-STAT 3, ART BLOOD GAS (G3+)
Patient temperature: 97.9
pCO2 arterial: 40.9 mmHg (ref 35.0–45.0)
pH, Arterial: 7.423 (ref 7.350–7.450)

## 2012-01-22 LAB — PHOSPHORUS: Phosphorus: 5.2 mg/dL — ABNORMAL HIGH (ref 2.3–4.6)

## 2012-01-22 LAB — CBC
Hemoglobin: 10.4 g/dL — ABNORMAL LOW (ref 13.0–17.0)
MCH: 30.1 pg (ref 26.0–34.0)
Platelets: 311 10*3/uL (ref 150–400)
RBC: 3.45 MIL/uL — ABNORMAL LOW (ref 4.22–5.81)
WBC: 9 10*3/uL (ref 4.0–10.5)

## 2012-01-22 LAB — GLUCOSE, CAPILLARY
Glucose-Capillary: 153 mg/dL — ABNORMAL HIGH (ref 70–99)
Glucose-Capillary: 176 mg/dL — ABNORMAL HIGH (ref 70–99)
Glucose-Capillary: 123 mg/dL — ABNORMAL HIGH (ref 70–99)

## 2012-01-22 LAB — MAGNESIUM: Magnesium: 2.4 mg/dL (ref 1.5–2.5)

## 2012-01-22 MED ORDER — FUROSEMIDE 10 MG/ML IJ SOLN
20.0000 mg | Freq: Four times a day (QID) | INTRAMUSCULAR | Status: AC
  Administered 2012-01-22 (×3): 20 mg via INTRAVENOUS
  Filled 2012-01-22 (×2): qty 2

## 2012-01-22 MED ORDER — POTASSIUM CHLORIDE 20 MEQ/15ML (10%) PO LIQD
ORAL | Status: AC
  Filled 2012-01-22: qty 30

## 2012-01-22 MED ORDER — POTASSIUM CHLORIDE 20 MEQ/15ML (10%) PO LIQD
40.0000 meq | Freq: Three times a day (TID) | ORAL | Status: AC
  Administered 2012-01-22 (×2): 40 meq
  Filled 2012-01-22 (×2): qty 30

## 2012-01-22 NOTE — Progress Notes (Signed)
HPI:  66 y/o WM with PMH of CAD, HTN, AAA s/p stenting from aorta to femorals admitted 3/25 after episode of chest pain at rest. 3/26 underwent planned cardiac cath with stent to LAD. Post cath began having chest pain and returned to Cath Lab. LAD stent had re-occluded and underwent balloon angioplasty with stent to LAD. Course further complicated by respiratory distress. PCCM consulted for assistance.  Antibiotics:   Vancomycin 3/30>>> Zosyn 3/30>>>  Cultures/Sepsis Markers:   BAL 3/30>>>Enterococcus (>100,000 colonies)  Access/Protocols:  3/26 OETT>>>  3/26 L Femoral Sheath Venous and Arterial>>>3/27 3/26 R IJ TLC>>>3/29 3/29 L IJ TLC>>> 3/27 L radial a-line>>>3/29  Best Practice: DVT: Hep SQ GI: Protonix  Subjective:  Patient alert today and follows commands.  Pt off vasopressors this morning  Physical Exam: Filed Vitals:   01/22/12 0800  BP: 88/54  Pulse: 80  Temp: 98.9 F (37.2 C)  Resp: 23    Intake/Output Summary (Last 24 hours) at 01/22/12 0820 Last data filed at 01/22/12 0800  Gross per 24 hour  Intake 3271.7 ml  Output   1905 ml  Net 1366.7 ml   Vent Mode:  [-] CPAP;PSV FiO2 (%):  [39.5 %-69.6 %] 40.2 % Set Rate:  [20 bmp] 20 bmp Vt Set:  [500 mL] 500 mL PEEP:  [5 cmH20] 5 cmH20 Pressure Support:  [5 cmH20-12 cmH20] 5 cmH20 Plateau Pressure:  [18 cmH20-19 cmH20] 19 cmH20  Neuro: agitated  Cardiac: RRR, Nl S1/S2, 2/6 SEM, no rubs or gallops.  Pulmonary: clearer, less secretions. GI: Soft, NT, ND and +BS. Extremities: 1+ edema. Skin: Clear  Labs: CBC    Component Value Date/Time   WBC 9.0 01/22/2012 0440   RBC 3.45* 01/22/2012 0440   HGB 10.4* 01/22/2012 0440   HCT 31.0* 01/22/2012 0440   PLT 311 01/22/2012 0440   MCV 89.9 01/22/2012 0440   MCH 30.1 01/22/2012 0440   MCHC 33.5 01/22/2012 0440   RDW 13.7 01/22/2012 0440   LYMPHSABS 0.9 01/20/2012 0500   MONOABS 0.8 01/20/2012 0500   EOSABS 0.7 01/20/2012 0500   BASOSABS 0.1 01/20/2012 0500   BMET      Component Value Date/Time   NA 138 01/22/2012 0440   K 3.9 01/22/2012 0440   CL 103 01/22/2012 0440   CO2 27 01/22/2012 0440   GLUCOSE 147* 01/22/2012 0440   BUN 24* 01/22/2012 0440   CREATININE 1.34 01/22/2012 0440   CALCIUM 8.2* 01/22/2012 0440   GFRNONAA 54* 01/22/2012 0440   GFRAA 62* 01/22/2012 0440   ABG    Component Value Date/Time   PHART 7.423 01/22/2012 0420   PCO2ART 40.9 01/22/2012 0420   PO2ART 73.0* 01/22/2012 0420   HCO3 26.8* 01/22/2012 0420   TCO2 28 01/22/2012 0420   ACIDBASEDEF 2.0 01/16/2012 0422   O2SAT 95.0 01/22/2012 0420    Lab 01/22/12 0440  MG 2.4   Lab Results  Component Value Date   CALCIUM 8.2* 01/22/2012   PHOS 5.2* 01/22/2012   Chest Xray: New left base atelectasis. Possible tiny right effusion.  Assessment & Plan:  Acute Respiratory Failure  - Secondary to acute cardiogenic pulmonary edema.  Purulent secretions, respiratory cultures positive for enterococcus which are pan-sensitive.  - Extubate patient today. - Continue Vancomycin and Zosyn. - Consider TEE to r/o endocarditis given that enterococcus most commonly causes endocarditis, meningitis and UTI.  Will defer that til patient is extubated and we have conferred with cardiology.  Unstable Angina - Status-post CATH with complicated stent to LAD  and cardiogenic shock.    - Plan per cards,    - Shock: interestingly, the patient mental status is usually the same with SBP of 60 or 110.  I wonder if we are reading falsely lowered SBP due to severe PVD.  - No IABP due to prior AAA surgery with multiple stents.    Renal  - Hx of renal insufficiency Creatinine is stable.   Lab 01/22/12 0440 01/21/12 1215 01/21/12 0415 01/20/12 0500 01/19/12 0415  CREATININE 1.34 1.31 1.29 1.31 1.20  Very low dose lasix today. Replace K. Strict I/O. BMET in AM.  ID - Purulent secretions.  WBC is lower  - Cont zosyn/vancomycin - Send blood cultures today.   Lab 01/22/12 0440 01/21/12 0415 01/20/12 0500  HGB 10.4* 11.1* 11.1*   HCT 31.0* 33.0* 32.5*  WBC 9.0 10.9* 9.8  PLT 311 292 281    TOBBIA,PATRICK, MD  Will extubate patient today.  Family is discussing re-intubation/trach/peg.    CC time 45 minutes.  Patient seen and examined, agree with above note.  I dictated the care and orders written for this patient under my direction.  [name], M.D. [phone]

## 2012-01-22 NOTE — Procedures (Signed)
Extubation Procedure Note  Patient Details:   Name: Nathan Mcgee DOB: 09-28-1946 MRN: [id number]   Airway Documentation:     Evaluation  O2 sats: stable throughout Complications: No apparent complications Patient did tolerate procedure well. Bilateral Breath Sounds: Clear;Diminished Suctioning: Airway Yes  Pt extubated and placed on 4LNC. No stridor present. HR 85 95% RR-17.   Adolm Joseph 01/22/2012, 10:18 AM

## 2012-01-22 NOTE — Progress Notes (Signed)
160 ml of fentanyl wasted and witnessed by Omelia Blackwater RN

## 2012-01-22 NOTE — Progress Notes (Signed)
SUBJECTIVE: Pt awake this am. Intubated. Indicates that he has no pain. No events.   BP 87/72  Pulse 84  Temp(Src) 97.9 F (36.6 C) (Oral)  Resp 21  Ht 5\' 8"  (1.727 m)  Wt 189 lb 2.5 oz (85.8 kg)  BMI 28.76 kg/m2  SpO2 95%  Intake/Output Summary (Last 24 hours) at 01/22/12 0708 Last data filed at 01/22/12 0600  Gross per 24 hour  Intake 3243.1 ml  Output   1965 ml  Net 1278.1 ml    PHYSICAL EXAM General: Intubated. Awake Neck: No JVD. No masses noted.  Lungs: Mechanical breath sounds bilaterally with no wheezes or rhonci noted.  Heart: RRR with no murmurs noted. Abdomen: Bowel sounds are present. Soft, non-tender.  Extremities: Trace lower extremity edema.   LABS: Basic Metabolic Panel:  Basename 01/22/12 0440 01/21/12 1215  NA 138 137  K 3.9 3.7  CL 103 101  CO2 27 27  GLUCOSE 147* 157*  BUN 24* 23  CREATININE 1.34 1.31  CALCIUM 8.2* 8.3*  MG 2.4 --  PHOS 5.2* --   CBC:  Basename 01/22/12 0440 01/21/12 0415 01/20/12 0500  WBC 9.0 10.9* --  NEUTROABS -- -- 7.4  HGB 10.4* 11.1* --  HCT 31.0* 33.0* --  MCV 89.9 87.8 --  PLT 311 292 --   Current Meds:    . amitriptyline  50 mg Oral QHS  . antiseptic oral rinse  1 application Mouth Rinse QID  . aspirin  81 mg Per Tube Daily  . atorvastatin  80 mg Per Tube q1800  . chlorhexidine  15 mL Mouth/Throat BID  . Chlorhexidine Gluconate Cloth  6 each Topical Daily  . feeding supplement  30 mL Per Tube TID  . heparin subcutaneous  5,000 Units Subcutaneous Q8H  . insulin aspart  0-20 Units Subcutaneous Q4H  . levothyroxine  137 mcg Per Tube QAC breakfast  . pantoprazole sodium  40 mg Per Tube QHS  . PARoxetine  20 mg Per Tube Daily  . phosphorus  500 mg Oral BID  . piperacillin-tazobactam (ZOSYN)  IV  3.375 g Intravenous Q8H  . potassium chloride  10 mEq Intravenous Q1 Hr x 4  . QUEtiapine  50 mg Oral BID  . Ticagrelor  90 mg Per Tube BID  . vancomycin  1,000 mg Intravenous Q12H  . DISCONTD: potassium  chloride  10 mEq Intravenous Q1 Hr x 4     ASSESSMENT AND PLAN:  1. Unstable angina/CAD: Pt admitted 01/14/12 with unstable angina. His cardiac enzymes were negative. He is known to have severe vascular disease. He was found to have a 99% stenosis of the proximal LAD and a 99% ulcerated plaque in the proximal Circumflex. I placed a drug eluting stent in the proximal to mid LAD and a drug eluting stent in the proximal Circumflex. A small diagonal branch was jailed by the LAD stent. He was noted to have disease in the distal segment of the LAD where the vessel became small caliber. He did well following the first procedure but began to have severe substernal chest pain one hour after his cath. His EKG showed ST segment elevation in the anterior leads. He was brought back emergently to the cath lab for relook cath. His LAD was occluded proximally, felt to be secondary to stent thrombosis vs dissection proximally. I opened the LAD and placed a DES in the proximal LAD extending back to the ostium. He went into cardiogenic shock with asystole and VT in  the cath lab. He was resuscitated but required intubation for resp failure and vasopressors for hemodynamic support. I could not place an IABP because of his severe peripheral vascular disease. His Plavix was stopped and he has been started on Brilinta. Integrilin was used initially after cath but he oozed from his groin site and he has had nosebleeds so Integrilin was stopped. Echo with LVEF 50-55% and his anterior wall is moving. Had continued to require Levophed for shock (cardiogenic and possibly component of sepsis)  -Continue ASA/Brilinta/statin.  -No beta blocker secondary to hypotension  -Wean Levophed as tolerated. Hopeful that this can be stopped today since sedation has been stopped.   2. Acute respiratory failure: Appreciate PCCM assistance. Vent management per PCCM. Antibiotics for possible sepsis. S/p bronchoscopy over the weekend with thick  secretions. CXR still appears to have some pulmonary edema. ? Would he benefit from diuresis today.   3. Cardiogenic shock: Continues to require Levophed for support although this has been weaned overnight.   4. Acute renal insufficiency: Mild worsening of renal function, stable.   5. Hypokalemia: Resolved  6. Dispo: Critically ill. Discussion with family at bedside. Continue CCU care. Heparin SQ for DVT prophylaxis. Nutrition per OG tube. Hopefully he can extubate today.     Nathan Mcgee  4/2/20137:08 AM

## 2012-01-22 NOTE — Evaluation (Signed)
Clinical/Bedside Swallow Evaluation Patient Details  Name: Nathan Mcgee MRN: [id number] DOB: Feb 18, 1946 Today's Date: 01/22/2012  Past Medical History:  Past Medical History  Diagnosis Date  . Coronary artery disease   . Hypertension   . AAA (abdominal aortic aneurysm)    Past Surgical History:  Past Surgical History  Procedure Date  . Coronary angioplasty   . Carotid endarterectomy    HPI:  66 y/o WM with PMH of CAD, HTN, AAA s/p stenting from aorta to femorals admitted 3/25 after episode of chest pain at rest. 3/26 underwent planned cardiac cath with stent to LAD. Post cath began having chest pain and returned to Cath Lab. LAD stent had re-occluded and underwent balloon angioplasty with stent to LAD. Course further complicated by respiratory distress.  ETT 3/26-4/2 with post-extubation hoarse  vocal quality.   Assessment/Recommendations/Treatment Plan Suspected Esophageal Findings Suspected Esophageal Findings: Belching  SLP Assessment Clinical Impression Statement: Demonstrates a functional oral-pharyngeal swallow without any clinical indicators of a dysphagia or aspiration risk despite hoarse vocal quality and extubation less than 8 hours prior to this exam.  No further skilled SLP f/u.  Risk for Aspiration: None  Swallow Evaluation Recommendations Diet Recommendations: Regular;Thin liquid Liquid Administration via: Cup;Straw Medication Administration: Whole meds with liquid Supervision: Patient able to self feed;Intermittent supervision to cue for compensatory strategies (Assist for dexerity of self-feeding) Compensations: Slow rate Postural Changes and/or Swallow Maneuvers: Seated upright 90 degrees;Upright 30-60 min after meal Oral Care Recommendations: Oral care BID Follow up Recommendations: None  Treatment Plan Treatment Plan Recommendations: No treatment recommended at this time    Myra Rude, M.S.,CCC-SLP Pager [phone] 01/22/2012,4:32 PM

## 2012-01-22 NOTE — Progress Notes (Signed)
Nutrition Follow-up  Diet Order:  Clear Liquid, Osmolite and Prostat TID still ordered Pt was extubated today. At time of RD visit pt continued with NG tube but EN was turned off. Pt resting with eyes closed. Wife at bedside.  Bedside swallow eval ordered for tomorrow  Meds: Scheduled Meds:   . amitriptyline  50 mg Oral QHS  . antiseptic oral rinse  1 application Mouth Rinse QID  . aspirin  81 mg Per Tube Daily  . atorvastatin  80 mg Per Tube q1800  . chlorhexidine  15 mL Mouth/Throat BID  . feeding supplement  30 mL Per Tube TID  . furosemide  20 mg Intravenous Q6H  . heparin subcutaneous  5,000 Units Subcutaneous Q8H  . insulin aspart  0-20 Units Subcutaneous Q4H  . levothyroxine  137 mcg Per Tube QAC breakfast  . pantoprazole sodium  40 mg Per Tube QHS  . PARoxetine  20 mg Per Tube Daily  . phosphorus  500 mg Oral BID  . piperacillin-tazobactam (ZOSYN)  IV  3.375 g Intravenous Q8H  . potassium chloride  40 mEq Per Tube TID  . potassium chloride      . QUEtiapine  50 mg Oral BID  . Ticagrelor  90 mg Per Tube BID  . vancomycin  1,000 mg Intravenous Q12H   Continuous Infusions:   . sodium chloride 10 mL/hr at 01/21/12 0400  . sodium chloride 20 mL/hr at 01/20/12 0600  . sodium chloride 10 mL/hr at 01/21/12 0400  . feeding supplement (OSMOLITE 1.2 CAL) 1,000 mL (01/21/12 1914)  . norepinephrine (LEVOPHED) Adult infusion 2.5 mcg/min (01/22/12 0835)  . DISCONTD: amiodarone (NEXTERONE PREMIX) 360 mg/200 mL dextrose    . DISCONTD: DOPamine Stopped (01/19/12 1757)  . DISCONTD: fentaNYL infusion INTRAVENOUS 50 mcg/hr (01/21/12 2235)  . DISCONTD: midazolam (VERSED) infusion 2 mg/hr (01/21/12 1800)   PRN Meds:.acetaminophen, alum & mag hydroxide-simeth, diphenhydrAMINE, fentaNYL, LORazepam, nitroGLYCERIN, ondansetron (ZOFRAN) IV  Labs:  CMP     Component Value Date/Time   NA 138 01/22/2012 0440   K 3.9 01/22/2012 0440   CL 103 01/22/2012 0440   CO2 27 01/22/2012 0440   GLUCOSE 147*  01/22/2012 0440   BUN 24* 01/22/2012 0440   CREATININE 1.34 01/22/2012 0440   CALCIUM 8.2* 01/22/2012 0440   PROT 5.4* 01/21/2012 0415   ALBUMIN 2.2* 01/21/2012 0415   AST 21 01/21/2012 0415   ALT 26 01/21/2012 0415   ALKPHOS 46 01/21/2012 0415   BILITOT 0.4 01/21/2012 0415   GFRNONAA 54* 01/22/2012 0440   GFRAA 62* 01/22/2012 0440     Intake/Output Summary (Last 24 hours) at 01/22/12 1401 Last data filed at 01/22/12 1300  Gross per 24 hour  Intake   2513 ml  Output   2720 ml  Net   -207 ml    Weight Status:  189 lbs, weight continues to trend up since 3/30. Admission weight 175 lbs.   Re-estimated needs:  1900-2100 kcal, 95-105 gm protein  Nutrition Dx:  Inadequate oral intake now r/t decreased appetite s/p extubation AEB no meals documented since diet advance today.   Goal:  TF to meet >90% of estimated nutrition needs, met New Goal: PO intake to meet >90% of estimated nutrition needs  Intervention:   1. TF and Pro-stat still active, will wait to add supplements until TF d/c'd and SLP has assessed swallow ability.  2. RD to continue to follow  Monitor:  Diet, PO intake, weight, labs   Clarene Duke MARIE Pager #:  [phone]  

## 2012-01-22 NOTE — Progress Notes (Signed)
UR Completed/ UPDATED Simmons, Christinia Lambeth F [phone]  

## 2012-01-22 NOTE — Progress Notes (Signed)
Unused 50ml bag of versed returned to pharmacy

## 2012-01-22 NOTE — Progress Notes (Signed)
ANTIBIOTIC CONSULT NOTE - INITIAL  Pharmacy Consult for Vancomycin/Zosyn Indication: PNA  Allergies  Allergen Reactions  . Codeine Itching    Patient Measurements: Height: 5\' 8"  (172.7 cm) Weight: 189 lb 2.5 oz (85.8 kg) IBW/kg (Calculated) : 68.4    Vital Signs: Temp: 98.9 F (37.2 C) (04/02 0800) Temp src: Oral (04/02 0800) BP: 138/86 mmHg (04/02 0900) Pulse Rate: 87  (04/02 1010) Intake/Output from previous day: 04/01 0701 - 04/02 0700 In: 3333.1 [I.V.:853.1; NG/GT:1780; IV Piggyback:700] Out: 1965 [Urine:1965] Intake/Output from this shift: Total I/O In: 469.4 [I.V.:69.4; NG/GT:150; IV Piggyback:250] Out: 95 [Urine:95]  Labs:  [facility] 01/22/12 0440 01/21/12 1215 01/21/12 0415 01/20/12 0500  WBC 9.0 -- 10.9* 9.8  HGB 10.4* -- 11.1* 11.1*  PLT 311 -- 292 281  LABCREA -- -- -- --  CREATININE 1.34 1.31 1.29 --   Estimated Creatinine Clearance: 57.8 ml/min (by C-G formula based on Cr of 1.34). No results found for this basename: VANCOTROUGH:2,VANCOPEAK:2,VANCORANDOM:2,GENTTROUGH:2,GENTPEAK:2,GENTRANDOM:2,TOBRATROUGH:2,TOBRAPEAK:2,TOBRARND:2,AMIKACINPEAK:2,AMIKACINTROU:2,AMIKACIN:2, in the last 72 hours   Microbiology: Recent Results (from the past 720 hour(s))  MRSA PCR SCREENING     Status: Abnormal   Collection Time   01/14/12  9:17 PM      Component Value Range Status Comment   MRSA by PCR POSITIVE (*) NEGATIVE  Final   CULTURE, BAL-QUANTITATIVE     Status: Normal   Collection Time   01/19/12  9:21 AM      Component Value Range Status Comment   Specimen Description BRONCHIAL ALVEOLAR LAVAGE   Final    Special Requests NONE   Final    Gram Stain     Final    Value: ABUNDANT WBC PRESENT, PREDOMINANTLY PMN     NO SQUAMOUS EPITHELIAL CELLS SEEN     NO ORGANISMS SEEN   Colony Count >=100,000 COLONIES/ML   Final    Culture ENTEROBACTER AEROGENES   Final    Report Status 01/21/2012 FINAL   Final    Organism ID, Bacteria ENTEROBACTER AEROGENES   Final      Medical History: Past Medical History  Diagnosis Date  . Coronary artery disease   . Hypertension   . AAA (abdominal aortic aneurysm)     Medications:  Scheduled:     . amitriptyline  50 mg Oral QHS  . antiseptic oral rinse  1 application Mouth Rinse QID  . aspirin  81 mg Per Tube Daily  . atorvastatin  80 mg Per Tube q1800  . chlorhexidine  15 mL Mouth/Throat BID  . feeding supplement  30 mL Per Tube TID  . furosemide  20 mg Intravenous Q6H  . heparin subcutaneous  5,000 Units Subcutaneous Q8H  . insulin aspart  0-20 Units Subcutaneous Q4H  . levothyroxine  137 mcg Per Tube QAC breakfast  . pantoprazole sodium  40 mg Per Tube QHS  . PARoxetine  20 mg Per Tube Daily  . phosphorus  500 mg Oral BID  . piperacillin-tazobactam (ZOSYN)  IV  3.375 g Intravenous Q8H  . potassium chloride  10 mEq Intravenous Q1 Hr x 4  . potassium chloride  40 mEq Per Tube TID  . QUEtiapine  50 mg Oral BID  . Ticagrelor  90 mg Per Tube BID  . vancomycin  1,000 mg Intravenous Q12H   Infusions:     . sodium chloride 10 mL/hr at 01/21/12 0400  . sodium chloride 20 mL/hr at 01/20/12 0600  . sodium chloride 10 mL/hr at 01/21/12 0400  . feeding supplement (OSMOLITE 1.2 CAL)  1,000 mL (01/21/12 1914)  . fentaNYL infusion INTRAVENOUS 50 mcg/hr (01/21/12 2235)  . midazolam (VERSED) infusion 2 mg/hr (01/21/12 1800)  . norepinephrine (LEVOPHED) Adult infusion 2.5 mcg/min (01/22/12 0835)  . DISCONTD: amiodarone (NEXTERONE PREMIX) 360 mg/200 mL dextrose    . DISCONTD: DOPamine Stopped (01/19/12 1757)   Anti-infectives     Start     Dose/Rate Route Frequency Ordered Stop   01/19/12 0900   vancomycin (VANCOCIN) IVPB 1000 mg/200 mL premix        1,000 mg 200 mL/hr over 60 Minutes Intravenous Every 12 hours 01/19/12 0829     01/19/12 0900   piperacillin-tazobactam (ZOSYN) IVPB 3.375 g        3.375 g 12.5 mL/hr over 240 Minutes Intravenous Every 8 hours 01/19/12 1610           Assessment: 66  y/o M admitted 3/25 after episode of chest pain. Cardiac cath 3/26 w/ LAD stent. CXR shows pulmonary edema improved w/ no clear infiltrate but purulent secretions noted. Pharmacy to start Vancomycin/Zosyn for empiric coverage of suspected PNA.  Resp culture growing enterobacter; sensitive to zosyn, ceftriaxone.  Renal fx is stable.   Goal of Therapy:  Vancomycin trough level 15-20 mcg/ml  Plan:  1) Consider vancomycin 1g IV q 12 hrs for now.  Can we d/c vancomycin soon?  If not, will check level soon. 2) Continue Zosyn 3.375g IV q 8 hrs.  Shavanna Furnari, Gwenlyn Found, PharmD 01/22/2012,10:19 AM

## 2012-01-23 ENCOUNTER — Inpatient Hospital Stay (HOSPITAL_COMMUNITY): Payer: Medicare Other

## 2012-01-23 LAB — MAGNESIUM: Magnesium: 2.3 mg/dL (ref 1.5–2.5)

## 2012-01-23 LAB — GLUCOSE, CAPILLARY
Glucose-Capillary: 114 mg/dL — ABNORMAL HIGH (ref 70–99)
Glucose-Capillary: 118 mg/dL — ABNORMAL HIGH (ref 70–99)
Glucose-Capillary: 122 mg/dL — ABNORMAL HIGH (ref 70–99)

## 2012-01-23 LAB — BASIC METABOLIC PANEL
BUN: 21 mg/dL (ref 6–23)
CO2: 29 mEq/L (ref 19–32)
Calcium: 8.6 mg/dL (ref 8.4–10.5)
GFR calc non Af Amer: 44 mL/min — ABNORMAL LOW (ref >90)
Glucose, Bld: 117 mg/dL — ABNORMAL HIGH (ref 70–99)

## 2012-01-23 LAB — VANCOMYCIN, TROUGH: Vancomycin Tr: 20.3 ug/mL — ABNORMAL HIGH (ref 10.0–20.0)

## 2012-01-23 LAB — CBC
MCH: 29.8 pg (ref 26.0–34.0)
MCHC: 34.2 g/dL (ref 30.0–36.0)
MCV: 87.2 fL (ref 78.0–100.0)
Platelets: 324 10*3/uL (ref 150–400)

## 2012-01-23 MED ORDER — HYDROCODONE-ACETAMINOPHEN 5-325 MG PO TABS
2.0000 | ORAL_TABLET | Freq: Once | ORAL | Status: AC
  Administered 2012-01-24: 2 via ORAL
  Filled 2012-01-23: qty 2

## 2012-01-23 MED ORDER — TEMAZEPAM 7.5 MG PO CAPS
7.5000 mg | ORAL_CAPSULE | Freq: Once | ORAL | Status: AC
  Administered 2012-01-24: 7.5 mg via ORAL
  Filled 2012-01-23: qty 1

## 2012-01-23 NOTE — Progress Notes (Signed)
HPI:  66 y/o WM with PMH of CAD, HTN, AAA s/p stenting from aorta to femorals admitted 3/25 after episode of chest pain at rest. 3/26 underwent planned cardiac cath with stent to LAD. Post cath began having chest pain and returned to Cath Lab. LAD stent had re-occluded and underwent balloon angioplasty with stent to LAD. Course further complicated by respiratory distress. PCCM consulted for assistance.  Antibiotics:   Vancomycin 3/30>>> Zosyn 3/30>>>  Cultures/Sepsis Markers:   BAL 3/30>>>Enterococcus (>100,000 colonies)  Access/Protocols:  3/26 OETT>>>  3/26 L Femoral Sheath Venous and Arterial>>>3/27 3/26 R IJ TLC>>>3/29 3/29 L IJ TLC>>> 3/27 L radial a-line>>>3/29  Best Practice: DVT: Hep SQ GI: Protonix  Subjective:   Physical Exam: Did well overnight. No major events,  Only c/o being hoarse. No chest pain or SOB.   Filed Vitals:   01/23/12 0800  BP: 145/69  Pulse: 83  Temp:   Resp: 21    Intake/Output Summary (Last 24 hours) at 01/23/12 0820 Last data filed at 01/23/12 0700  Gross per 24 hour  Intake 1283.4 ml  Output   4431 ml  Net -3147.6 ml   Vent Mode:  [-]  FiO2 (%):  [39.7 %-40.2 %] 40.2 %  Neuro: agitated  Cardiac: RRR, Nl S1/S2, 2/6 SEM, no rubs or gallops.  Pulmonary: clearer, less secretions. GI: Soft, NT, ND and +BS. Extremities: 1+ edema. Skin: Clear  Labs: CBC    Component Value Date/Time   WBC 11.6* 01/23/2012 0450   RBC 3.59* 01/23/2012 0450   HGB 10.7* 01/23/2012 0450   HCT 31.3* 01/23/2012 0450   PLT 324 01/23/2012 0450   MCV 87.2 01/23/2012 0450   MCH 29.8 01/23/2012 0450   MCHC 34.2 01/23/2012 0450   RDW 13.5 01/23/2012 0450   LYMPHSABS 0.9 01/20/2012 0500   MONOABS 0.8 01/20/2012 0500   EOSABS 0.7 01/20/2012 0500   BASOSABS 0.1 01/20/2012 0500   BMET    Component Value Date/Time   NA 138 01/23/2012 0450   K 3.8 01/23/2012 0450   CL 99 01/23/2012 0450   CO2 29 01/23/2012 0450   GLUCOSE 117* 01/23/2012 0450   BUN 21 01/23/2012 0450   CREATININE 1.57*  01/23/2012 0450   CALCIUM 8.6 01/23/2012 0450   GFRNONAA 44* 01/23/2012 0450   GFRAA 51* 01/23/2012 0450   ABG    Component Value Date/Time   PHART 7.423 01/22/2012 0420   PCO2ART 40.9 01/22/2012 0420   PO2ART 73.0* 01/22/2012 0420   HCO3 26.8* 01/22/2012 0420   TCO2 28 01/22/2012 0420   ACIDBASEDEF 2.0 01/16/2012 0422   O2SAT 95.0 01/22/2012 0420    Lab 01/23/12 0450  MG 2.3   Lab Results  Component Value Date   CALCIUM 8.6 01/23/2012   PHOS 4.3 01/23/2012   Chest Xray: New left base atelectasis. Possible tiny right effusion.  Assessment & Plan:  Acute Respiratory Failure  - Secondary to acute cardiogenic pulmonary edema.  Purulent secretions, respiratory cultures positive for enterococcus which are pan-sensitive.  - Pt extubated 4/2. - Continue Vancomycin and Zosyn. - Consider TEE to r/o endocarditis given that enterococcus most commonly causes endocarditis, meningitis and UTI.   Unstable Angina - Status-post CATH with complicated stent to LAD and cardiogenic shock.    - Shock: interestingly, the patient mental status is usually the same with SBP of 60 or 110.  I wonder if we are reading falsely lowered SBP due to severe PVD.  - No IABP due to prior AAA surgery with  multiple stents.   - Plan per cards,    - Stable to transfer to SDU.   Renal  - Hx of renal insufficiency Creatinine is slightly elevated given diuresis.  - Strict I/O. - BMET in AM.   Lab 01/23/12 0450 01/22/12 0440 01/21/12 1215 01/21/12 0415 01/20/12 0500  CREATININE 1.57* 1.34 1.31 1.29 1.31   ID - Purulent secretions.  WBC is lower  - Cont zosyn/vanc until endocarditis is ruled out, will defer to cardiology.  If endocarditis is ruled out then vancomycin can be d/ced and zosyn changed to augmentin for a total abx days of 14. - Send blood cultures today. - Consider TEE to r/o endocarditis.    Lab 01/23/12 0450 01/22/12 0440 01/21/12 0415  HGB 10.7* 10.4* 11.1*  HCT 31.3* 31.0* 33.0*  WBC 11.6* 9.0 10.9*  PLT 324  311 292    TOBBIA,PATRICK, MD  PCCM signing off, please call back if needed.  Patient seen and examined, agree with above note.  I dictated the care and orders written for this patient under my direction.  [name], M.D. [phone]

## 2012-01-23 NOTE — Evaluation (Signed)
Physical Therapy Evaluation Patient Details Name: Nathan Mcgee MRN: [id number] DOB: 05-29-1946 Today's Date: 01/23/2012  Problem List:  Patient Active Problem List  Diagnoses  . Acute respiratory failure with hypoxia  . Cardiogenic shock  . Acute MI  . Bradycardia    Past Medical History:  Past Medical History  Diagnosis Date  . Coronary artery disease   . Hypertension   . AAA (abdominal aortic aneurysm)    Past Surgical History:  Past Surgical History  Procedure Date  . Coronary angioplasty   . Carotid endarterectomy     PT Assessment/Plan/Recommendation PT Assessment Clinical Impression Statement: pt admitted for planned stenting.  CP after procedure as pre procedure, so returned to cath for revision.  Intubated after cath.  As of evaluation, pt is extubated on 2L [place]. and although has good strength, he has decr activity tolerance, decr balance and mil gait disturbance.  Pt can benefit from HHPT to address these issues. PT Recommendation/Assessment: Patient will need skilled PT in the acute care venue PT Problem List: Decreased activity tolerance;Decreased balance;Decreased mobility;Decreased knowledge of use of DME Barriers to Discharge: Decreased caregiver support;Other (comment) (pt's sister may be coming to supervise him after D/C) PT Therapy Diagnosis : Other (comment) (decr activity tolerance and balance) PT Plan PT Frequency: Min 3X/week PT Treatment/Interventions: DME instruction;Gait training;Stair training;Functional mobility training;Therapeutic activities;Balance training;Patient/family education PT Recommendation Follow Up Recommendations: Home health PT Equipment Recommended: None recommended by PT PT Goals  Acute Rehab PT Goals PT Goal Formulation: With patient/family Time For Goal Achievement: 7 days Pt will go Supine/Side to Sit: with modified independence PT Goal: Supine/Side to Sit - Progress: Goal set today Pt will go Sit to Stand: with modified  independence PT Goal: Sit to Stand - Progress: Goal set today Pt will Transfer Bed to Chair/Chair to Bed: with modified independence PT Transfer Goal: Bed to Chair/Chair to Bed - Progress: Goal set today Pt will Ambulate: >150 feet;with modified independence;with least restrictive assistive device PT Goal: Ambulate - Progress: Goal set today Pt will Go Up / Down Stairs: 1-2 stairs;with modified independence PT Goal: Up/Down Stairs - Progress: Goal set today  PT Evaluation Precautions/Restrictions  Precautions Precautions: Fall Restrictions Weight Bearing Restrictions: No Prior Functioning  Home Living Lives With: Spouse Type of Home: House Home Layout: One level;Able to live on main level with bedroom/bathroom Home Access: Stairs to enter Entrance Stairs-Rails: None Entrance Stairs-Number of Steps: 2 Bathroom Toilet: Standard Home Adaptive Equipment: None Prior Function Level of Independence: Independent with basic ADLs;Independent with gait;Independent with transfers;Independent with homemaking with ambulation Able to Take Stairs?: Yes Driving: Yes Cognition Cognition Arousal/Alertness: Awake/alert Overall Cognitive Status: Appears within functional limits for tasks assessed Orientation Level: Oriented X4 Sensation/Coordination Coordination Gross Motor Movements are Fluid and Coordinated: Yes Fine Motor Movements are Fluid and Coordinated: Not tested Extremity Assessment RUE Assessment RUE Assessment: Within Functional Limits LUE Assessment LUE Assessment: Within Functional Limits RLE Assessment RLE Assessment: Within Functional Limits LLE Assessment LLE Assessment: Within Functional Limits Mobility (including Balance) Bed Mobility Bed Mobility: No Transfers Transfers: Yes Sit to Stand: 4: Min assist;From chair/3-in-1;With upper extremity assist Sit to Stand Details (indicate cue type and reason): Initially, pt had trouble leaning forward to balance himself in  midline.; needed vc's for hand placement and assist to translate forward over his BOS Stand to Sit: 4: Min assist;To chair/3-in-1 Ambulation/Gait Ambulation/Gait: Yes Ambulation/Gait Assistance: Other (comment) (min guard A) Ambulation Distance (Feet): 40 Feet (in room  (tethered to the monitor)) Assistive  device: Rolling walker Gait Pattern: Step-through pattern;Decreased step length - right;Decreased step length - left;Decreased stride length Stairs: No Wheelchair Mobility Wheelchair Mobility: No  Posture/Postural Control Posture/Postural Control: No significant limitations (vc's for postural checks only) Balance Balance Assessed: Yes Static Standing Balance Static Standing - Balance Support: Bilateral upper extremity supported Static Standing - Level of Assistance: 4: Min assist Exercise    End of Session PT - End of Session Activity Tolerance: Patient tolerated treatment well Patient left: in chair;with family/visitor present;with call bell in reach Nurse Communication: Mobility status for transfers;Mobility status for ambulation General Behavior During Session: [facility] for tasks performed Cognition: [facility] for tasks performed  Bright Spielmann, Eliseo Gum 01/23/2012, 11:45 AM  01/23/2012  [place] Bing, PT [phone] (pager)

## 2012-01-23 NOTE — Progress Notes (Signed)
eLink Physician-Brief Progress Note Patient Name: Nathan Mcgee DOB: 12/02/1945 MRN: [id number]  Date of Service  01/23/2012   HPI/Events of Note  1 General headache. No focal neuro deficits per rn. On few anti platelet agents. Tyelol 650mg  did not help. Fentanyl x 1 did not help  2. Insomnia - asking for something for sleep    1. Try vicodin 2 tabs 2. Try restoril 7.5mg    Intervention Category Intermediate Interventions: Pain - evaluation and management  Nimsi Males 01/23/2012, 11:53 PM

## 2012-01-23 NOTE — Progress Notes (Signed)
01-23-12 1610 Tomi Bamberger, RN,BSN [phone] Pt was discussed in long length of stay meeting.

## 2012-01-23 NOTE — Progress Notes (Signed)
SUBJECTIVE:Did well overnight. Only c/o being hoarse. No chest pain or SOB.   BP 117/70  Pulse 74  Temp(Src) 98 F (36.7 C) (Oral)  Resp 17  Ht 5\' 8"  (1.727 m)  Wt 180 lb 12.4 oz (82 kg)  BMI 27.49 kg/m2  SpO2 95%  Intake/Output Summary (Last 24 hours) at 01/23/12 0753 Last data filed at 01/23/12 0700  Gross per 24 hour  Intake 1393.4 ml  Output   4526 ml  Net -3132.6 ml    PHYSICAL EXAM General: Well developed, well nourished, in no acute distress. Alert and oriented x 3.  Psych:  Good affect, responds appropriately Neck: No JVD. No masses noted.  Lungs: Clear bilaterally with no wheezes or rhonci noted.  Heart: RRR with no murmurs noted. Abdomen: Bowel sounds are present. Soft, non-tender.  Extremities: No lower extremity edema.   LABS: Basic Metabolic Panel:  Basename 01/23/12 0450 01/22/12 0440  NA 138 138  K 3.8 3.9  CL 99 103  CO2 29 27  GLUCOSE 117* 147*  BUN 21 24*  CREATININE 1.57* 1.34  CALCIUM 8.6 8.2*  MG 2.3 2.4  PHOS 4.3 5.2*   CBC:  Basename 01/23/12 0450 01/22/12 0440  WBC 11.6* 9.0  NEUTROABS -- --  HGB 10.7* 10.4*  HCT 31.3* 31.0*  MCV 87.2 89.9  PLT 324 311    Current Meds:    . amitriptyline  50 mg Oral QHS  . antiseptic oral rinse  1 application Mouth Rinse QID  . aspirin  81 mg Per Tube Daily  . atorvastatin  80 mg Per Tube q1800  . chlorhexidine  15 mL Mouth/Throat BID  . feeding supplement  30 mL Per Tube TID  . furosemide  20 mg Intravenous Q6H  . heparin subcutaneous  5,000 Units Subcutaneous Q8H  . insulin aspart  0-20 Units Subcutaneous Q4H  . levothyroxine  137 mcg Per Tube QAC breakfast  . pantoprazole sodium  40 mg Per Tube QHS  . PARoxetine  20 mg Per Tube Daily  . piperacillin-tazobactam (ZOSYN)  IV  3.375 g Intravenous Q8H  . potassium chloride  40 mEq Per Tube TID  . potassium chloride      . QUEtiapine  50 mg Oral BID  . Ticagrelor  90 mg Per Tube BID  . vancomycin  1,000 mg Intravenous Q12H      ASSESSMENT AND PLAN:  1. Unstable angina/CAD: Pt admitted 01/14/12 with unstable angina. His cardiac enzymes were negative. He is known to have severe vascular disease. He was found to have a 99% stenosis of the proximal LAD and a 99% ulcerated plaque in the proximal Circumflex. I placed a drug eluting stent in the proximal to mid LAD and a drug eluting stent in the proximal Circumflex. A small diagonal branch was jailed by the LAD stent. He was noted to have disease in the distal segment of the LAD where the vessel became small caliber. He did well following the first procedure but began to have severe substernal chest pain one hour after his cath. His EKG showed ST segment elevation in the anterior leads. He was brought back emergently to the cath lab for relook cath. His LAD was occluded proximally, felt to be secondary to stent thrombosis vs dissection proximally. I opened the LAD and placed a DES in the proximal LAD extending back to the ostium. He went into cardiogenic shock with asystole and VT in the cath lab. He was resuscitated but required intubation for  resp failure and vasopressors for hemodynamic support. I could not place an IABP because of his severe peripheral vascular disease. His Plavix was stopped and he has been started on Brilinta. Integrilin was used initially after cath but he oozed from his groin site and he has had nosebleeds so Integrilin was stopped. Echo with LVEF 50-55% and his anterior wall is moving. He required pressors until yesterday. Levophed stopped yesterday am. Pt extubated yesterday am.   -Continue ASA/Brilinta/statin.  -No beta blocker secondary to hypotension  -PT and cardiac rehab today -d/c foley -central line can probably be left in place today and d/c'ed tomorrow if he is stable -transfer to stepdown ICU  2. Acute respiratory failure: Doing well after extubation. Appreciate PCCM assistance.  Antibiotics for possible sepsis per PCCM.  3. Cardiogenic  shock: Resolved. Off of pressors.    4. Acute renal insufficiency: Mild worsening of renal function. Will d/c Lasix today and follow.   5. Hypokalemia: Resolved   6. Dispo: To stepdown. Heparin SQ for DVT prophylaxis.     Nathan Mcgee  4/3/20137:53 AM

## 2012-01-24 ENCOUNTER — Encounter (HOSPITAL_COMMUNITY): Payer: Self-pay | Admitting: Dietician

## 2012-01-24 DIAGNOSIS — I517 Cardiomegaly: Secondary | ICD-10-CM

## 2012-01-24 LAB — CBC
HCT: 30.9 % — ABNORMAL LOW (ref 39.0–52.0)
MCH: 29.4 pg (ref 26.0–34.0)
MCHC: 34 g/dL (ref 30.0–36.0)
MCV: 86.6 fL (ref 78.0–100.0)
Platelets: 349 10*3/uL (ref 150–400)
RDW: 13.2 % (ref 11.5–15.5)
WBC: 7.7 10*3/uL (ref 4.0–10.5)

## 2012-01-24 LAB — BASIC METABOLIC PANEL
BUN: 16 mg/dL (ref 6–23)
CO2: 28 mEq/L (ref 19–32)
Calcium: 8.5 mg/dL (ref 8.4–10.5)
Chloride: 101 mEq/L (ref 96–112)
Creatinine, Ser: 1.47 mg/dL — ABNORMAL HIGH (ref 0.50–1.35)

## 2012-01-24 MED ORDER — HYDROCODONE-ACETAMINOPHEN 5-325 MG PO TABS
1.0000 | ORAL_TABLET | Freq: Four times a day (QID) | ORAL | Status: DC | PRN
  Administered 2012-01-24: 2 via ORAL
  Filled 2012-01-24: qty 2

## 2012-01-24 MED ORDER — METOPROLOL TARTRATE 25 MG PO TABS
25.0000 mg | ORAL_TABLET | Freq: Two times a day (BID) | ORAL | Status: DC
  Administered 2012-01-24 – 2012-01-27 (×7): 25 mg via ORAL
  Filled 2012-01-24 (×8): qty 1

## 2012-01-24 MED ORDER — ALPRAZOLAM 0.25 MG PO TABS
0.2500 mg | ORAL_TABLET | Freq: Every evening | ORAL | Status: DC | PRN
  Administered 2012-01-24 (×2): 0.25 mg via ORAL
  Administered 2012-01-25 – 2012-01-26 (×2): 0.5 mg via ORAL
  Filled 2012-01-24: qty 1
  Filled 2012-01-24 (×2): qty 2
  Filled 2012-01-24: qty 1

## 2012-01-24 MED ORDER — POTASSIUM CHLORIDE CRYS ER 20 MEQ PO TBCR
20.0000 meq | EXTENDED_RELEASE_TABLET | Freq: Every day | ORAL | Status: DC
  Administered 2012-01-24 – 2012-01-27 (×4): 20 meq via ORAL
  Filled 2012-01-24 (×4): qty 1

## 2012-01-24 MED ORDER — AMOXICILLIN-POT CLAVULANATE 875-125 MG PO TABS
1.0000 | ORAL_TABLET | Freq: Two times a day (BID) | ORAL | Status: DC
  Administered 2012-01-24 – 2012-01-27 (×7): 1 via ORAL
  Filled 2012-01-24 (×11): qty 1

## 2012-01-24 MED ORDER — HYDROCODONE-ACETAMINOPHEN 5-325 MG PO TABS: 1.0000 | ORAL_TABLET | Freq: Four times a day (QID) | ORAL | Status: DC | PRN

## 2012-01-24 MED ORDER — WHITE PETROLATUM GEL
Status: AC
  Administered 2012-01-24: 09:00:00
  Filled 2012-01-24: qty 5

## 2012-01-24 NOTE — Progress Notes (Signed)
Report was called to RN on unit 3700. Pt will transfer to room 3715.  Family is at the bedside.

## 2012-01-24 NOTE — Progress Notes (Signed)
   CARE MANAGEMENT NOTE 01/24/2012  Patient:  Nathan Mcgee, Nathan Mcgee   Account Number:  [id number]  Date Initiated:  01/21/2012  Documentation initiated by:  GRAVES-BIGELOW,Chloe Bluett  Subjective/Objective Assessment:   Pt admitted with PMH of CAD, HTN, AAA s/p stenting from aorta to femorals admitted 3/25 after episode of chest pain at rest. 3/26 underwent planned cath with stent to LAD. Pt intubated. Antibiotics. S/p bronchoscopy ovr the weekend.     Action/Plan:   Continues to require Levophed for support. Nutrition per OG tube. Question tracheostomy- family to think about. Pt is from home with wife.   Anticipated DC Date:  02/01/2012   Anticipated DC Plan:  LONG TERM ACUTE CARE (LTAC)      DC Planning Services  CM consult      Choice offered to / List presented to:             Status of service:  Completed, signed off Medicare Important Message given?   (If response is "NO", the following Medicare IM given date fields will be blank) Date Medicare IM given:   Date Additional Medicare IM given:    Discharge Disposition:  HOME/SELF CARE  Per UR Regulation:    If discussed at Long Length of Stay Meetings, dates discussed:   01/23/2012    Comments:  01-24-12 [address], RN,BSN [phone] CM spoke to pt about PT recommendations for HHPT and he refused at this time. Pt stated he did not want to think about it. CM then asked if he had a RW at home and he stated no. CM then gave pt a 30 day free brilinta card and he uses [facility] in [place]- and they have 1 bottle available. Benefits check in process will make pt aware when complete. No other needs for CM- signing off at this time.  01-23-12 2841 Tomi Bamberger, RN,BSN [phone] Pt was discussed in long length of stay meeting.  01-21-12 [address] [place] [phone] CM will continue to monitor for d/c disposition needs.

## 2012-01-24 NOTE — Progress Notes (Signed)
*  PRELIMINARY RESULTS* Echocardiogram 2D Echocardiogram has been performed.  Glean Salen [facility] 01/24/2012, 9:30 AM

## 2012-01-24 NOTE — Progress Notes (Signed)
SUBJECTIVE: Pt did not sleep well overnight. Some paranoid actions with nursing. No chest pain or SOB.   BP 133/59  Pulse 79  Temp(Src) 97.7 F (36.5 C) (Oral)  Resp 20  Ht 5\' 8"  (1.727 m)  Wt 177 lb 4 oz (80.4 kg)  BMI 26.95 kg/m2  SpO2 100%  Intake/Output Summary (Last 24 hours) at 01/24/12 0648 Last data filed at 01/24/12 0021  Gross per 24 hour  Intake   1120 ml  Output   1366 ml  Net   -246 ml    PHYSICAL EXAM General: Well developed, well nourished, in no acute distress. Alert and oriented x 3.  Psych:  Good affect, responds appropriately Neck: No JVD. No masses noted.  Lungs: Clear bilaterally with no wheezes or rhonci noted.  Heart: RRR with no murmurs noted. Abdomen: Bowel sounds are present. Soft, non-tender.  Extremities: No lower extremity edema.   LABS: Basic Metabolic Panel:  Basename 01/24/12 0450 01/23/12 0450 01/22/12 0440  NA 137 138 --  K 3.3* 3.8 --  CL 101 99 --  CO2 28 29 --  GLUCOSE 99 117* --  BUN 16 21 --  CREATININE 1.47* 1.57* --  CALCIUM 8.5 8.6 --  MG -- 2.3 2.4  PHOS -- 4.3 5.2*   CBC:  Basename 01/24/12 0450 01/23/12 0450  WBC 7.7 11.6*  NEUTROABS -- --  HGB 10.5* 10.7*  HCT 30.9* 31.3*  MCV 86.6 87.2  PLT 349 324    Current Meds:    . amitriptyline  50 mg Oral QHS  . antiseptic oral rinse  1 application Mouth Rinse QID  . aspirin  81 mg Per Tube Daily  . atorvastatin  80 mg Per Tube q1800  . chlorhexidine  15 mL Mouth/Throat BID  . heparin subcutaneous  5,000 Units Subcutaneous Q8H  . HYDROcodone-acetaminophen  2 tablet Oral Once  . levothyroxine  137 mcg Per Tube QAC breakfast  . pantoprazole sodium  40 mg Per Tube QHS  . PARoxetine  20 mg Per Tube Daily  . piperacillin-tazobactam (ZOSYN)  IV  3.375 g Intravenous Q8H  . temazepam  7.5 mg Oral Once  . Ticagrelor  90 mg Per Tube BID  . vancomycin  1,000 mg Intravenous Q12H  . DISCONTD: feeding supplement  30 mL Per Tube TID  . DISCONTD: insulin aspart  0-20  Units Subcutaneous Q4H  . DISCONTD: QUEtiapine  50 mg Oral BID     ASSESSMENT AND PLAN:  1. Unstable angina/CAD: Pt admitted 01/14/12 with unstable angina. His cardiac enzymes were negative. He is known to have severe vascular disease. He was found to have a 99% stenosis of the proximal LAD and a 99% ulcerated plaque in the proximal Circumflex. I placed a drug eluting stent in the proximal to mid LAD and a drug eluting stent in the proximal Circumflex. A small diagonal branch was jailed by the LAD stent. He was noted to have disease in the distal segment of the LAD where the vessel became small caliber. He did well following the first procedure but began to have severe substernal chest pain one hour after his cath. His EKG showed ST segment elevation in the anterior leads. He was brought back emergently to the cath lab for relook cath. His LAD was occluded proximally, felt to be secondary to stent thrombosis vs dissection proximally. I opened the LAD and placed a DES in the proximal LAD extending back to the ostium. He went into cardiogenic shock with  asystole and VT in the cath lab. He was resuscitated but required intubation for resp failure and vasopressors for hemodynamic support. I could not place an IABP because of his severe peripheral vascular disease. His Plavix was stopped and he has been started on Brilinta. Integrilin was used initially after cath but he oozed from his groin site and he has had nosebleeds so Integrilin was stopped. Echo with LVEF 50-55% and his anterior wall is moving. He required pressors until 01/22/12. Pt extubated 01/22/12.   -Continue ASA/Brilinta/statin.  -Will add low dose beta blocker now that hypotension has resolved -PT and cardiac rehab today  -d/c central line --transfer to telemetry - echo today to assess LV function now that he is off of pressors.   2. Acute respiratory failure: Resolved.   3. Cardiogenic shock: Resolved. Off of pressors.   4. Acute renal  insufficiency: Slightly improved. Will not give Lasix today.  5. Hypokalemia: Replace potassium today.  6. ID: BAL positive for enterococcus that is pan-sensitive. Will stop Vancomycin and Zosyn today and change to Augmentin for total of 14 days.   7. Dispo: To telemetry. Will continue heparin SQ for DVT prophylaxis.   Nathan Mcgee  4/4/20136:48 AM

## 2012-01-24 NOTE — Progress Notes (Signed)
ANTIBIOTIC CONSULT NOTE - INITIAL  Pharmacy Consult for Vancomycin/Zosyn Indication: PNA  Allergies  Allergen Reactions  . Codeine Itching    Patient Measurements: Height: 5\' 8"  (172.7 cm) Weight: 180 lb 12.4 oz (82 kg) IBW/kg (Calculated) : 68.4    Vital Signs: Temp: 98.2 F (36.8 C) (04/04 0005) Temp src: Oral (04/03 1956) BP: 125/51 mmHg (04/04 0005) Pulse Rate: 83  (04/04 0005) Intake/Output from previous day: 04/03 0701 - 04/04 0700 In: 900 [P.O.:600; IV Piggyback:300] Out: 991 [Urine:990; Stool:1] Intake/Output from this shift:    Labs:  Basename 01/23/12 0450 01/22/12 0440 01/21/12 1215 01/21/12 0415  WBC 11.6* 9.0 -- 10.9*  HGB 10.7* 10.4* -- 11.1*  PLT 324 311 -- 292  LABCREA -- -- -- --  CREATININE 1.57* 1.34 1.31 --   Estimated Creatinine Clearance: 44.8 ml/min (by C-G formula based on Cr of 1.57).  Basename 01/23/12 2230  VANCOTROUGH 20.3*  VANCOPEAK --  Drue Dun --  GENTTROUGH --  GENTPEAK --  GENTRANDOM --  TOBRATROUGH --  TOBRAPEAK --  TOBRARND --  AMIKACINPEAK --  AMIKACINTROU --  AMIKACIN --     Microbiology: Recent Results (from the past 720 hour(s))  MRSA PCR SCREENING     Status: Abnormal   Collection Time   01/14/12  9:17 PM      Component Value Range Status Comment   MRSA by PCR POSITIVE (*) NEGATIVE  Final   CULTURE, BAL-QUANTITATIVE     Status: Normal   Collection Time   01/19/12  9:21 AM      Component Value Range Status Comment   Specimen Description BRONCHIAL ALVEOLAR LAVAGE   Final    Special Requests NONE   Final    Gram Stain     Final    Value: ABUNDANT WBC PRESENT, PREDOMINANTLY PMN     NO SQUAMOUS EPITHELIAL CELLS SEEN     NO ORGANISMS SEEN   Colony Count >=100,000 COLONIES/ML   Final    Culture ENTEROBACTER AEROGENES   Final    Report Status 01/21/2012 FINAL   Final    Organism ID, Bacteria ENTEROBACTER AEROGENES   Final     Medical History: Past Medical History  Diagnosis Date  . Coronary artery  disease   . Hypertension   . AAA (abdominal aortic aneurysm)     Medications:  Scheduled:     . amitriptyline  50 mg Oral QHS  . antiseptic oral rinse  1 application Mouth Rinse QID  . aspirin  81 mg Per Tube Daily  . atorvastatin  80 mg Per Tube q1800  . chlorhexidine  15 mL Mouth/Throat BID  . heparin subcutaneous  5,000 Units Subcutaneous Q8H  . HYDROcodone-acetaminophen  2 tablet Oral Once  . levothyroxine  137 mcg Per Tube QAC breakfast  . pantoprazole sodium  40 mg Per Tube QHS  . PARoxetine  20 mg Per Tube Daily  . piperacillin-tazobactam (ZOSYN)  IV  3.375 g Intravenous Q8H  . temazepam  7.5 mg Oral Once  . Ticagrelor  90 mg Per Tube BID  . vancomycin  1,000 mg Intravenous Q12H  . DISCONTD: feeding supplement  30 mL Per Tube TID  . DISCONTD: insulin aspart  0-20 Units Subcutaneous Q4H  . DISCONTD: QUEtiapine  50 mg Oral BID   Infusions:     . sodium chloride 10 mL/hr at 01/23/12 1646  . DISCONTD: sodium chloride 10 mL/hr at 01/21/12 0400  . DISCONTD: sodium chloride 20 mL/hr at 01/20/12 0600  . DISCONTD:  feeding supplement (OSMOLITE 1.2 CAL) 1,000 mL (01/21/12 1914)  . DISCONTD: norepinephrine (LEVOPHED) Adult infusion 2.5 mcg/min (01/22/12 0835)   Anti-infectives     Start     Dose/Rate Route Frequency Ordered Stop   01/19/12 0900   vancomycin (VANCOCIN) IVPB 1000 mg/200 mL premix        1,000 mg 200 mL/hr over 60 Minutes Intravenous Every 12 hours 01/19/12 0829     01/19/12 0900   piperacillin-tazobactam (ZOSYN) IVPB 3.375 g        3.375 g 12.5 mL/hr over 240 Minutes Intravenous Every 8 hours 01/19/12 1610           Assessment: 66 y/o M admitted 3/25 after episode of chest pain. Patient is on D2 of  Vancomycin/Zosyn for suspected PNA.  Resp culture growing Enterobacter; sensitive to zosyn, ceftriaxone. Vancomycin trough of 20.3 mcg/mL.   Goal of Therapy:  Vancomycin trough level 15-20 mcg/ml  Plan:  1) Continue vancomycin 1g IV q 12 hrs. If SrCr  continues to rise, then may need to adjust vancomycin dosing. 2) Continue Zosyn 3.375g IV q 8 hrs.  Emeline Gins, PharmD 01/24/2012,12:10 AM

## 2012-01-24 NOTE — Progress Notes (Signed)
CARDIAC REHAB PHASE I   PRE:  Rate/Rhythm: 69 SR    BP: sitting 133/89    SaO2: 99 RA  MODE:  Ambulation: 130 ft   POST:  Rate/Rhythm: 79 SR    BP: sitting 107/69     SaO2: 99 RA  Pt much stronger today. Used RW and assist x2. Fairly steady. Fatigued toward end of walk. SaO2 good. Probably can be x1 tomorrow. To recliner.  [id number]  Nathan Mcgee CES, ACSM

## 2012-01-24 NOTE — Progress Notes (Signed)
Pt is a 1 ppd smoker and wants to quit cold [place]. Discussed different pharmacotherapy options available if unable to quit cold [place]. Pt verbalizes understanding. Referred to 1-800 quit now for f/u and support. Discussed oral fixation substitutes, second hand smoke and in home smoking policy. Reviewed and gave pt Written education/contact information.

## 2012-01-25 LAB — CBC
Hemoglobin: 10.8 g/dL — ABNORMAL LOW (ref 13.0–17.0)
MCH: 29.9 pg (ref 26.0–34.0)
MCHC: 33.8 g/dL (ref 30.0–36.0)
Platelets: 370 10*3/uL (ref 150–400)
RBC: 3.61 MIL/uL — ABNORMAL LOW (ref 4.22–5.81)

## 2012-01-25 LAB — BASIC METABOLIC PANEL
Calcium: 8.7 mg/dL (ref 8.4–10.5)
GFR calc Af Amer: 57 mL/min — ABNORMAL LOW (ref >90)
GFR calc non Af Amer: 49 mL/min — ABNORMAL LOW (ref >90)
Potassium: 3.8 mEq/L (ref 3.5–5.1)
Sodium: 137 mEq/L (ref 135–145)

## 2012-01-25 NOTE — Progress Notes (Signed)
PT  Cancellation Note   Treatment cancelled today due to pt just arriving back from walking down the hall 01/25/2012  [facility], PT [phone] (pager)

## 2012-01-25 NOTE — Progress Notes (Signed)
SUBJECTIVE: No chest pain or SOB. No events.   BP 95/59  Pulse 72  Temp(Src) 98.1 F (36.7 C) (Oral)  Resp 20  Ht 5\' 8"  (1.727 m)  Wt 177 lb 4 oz (80.4 kg)  BMI 26.95 kg/m2  SpO2 95%  Intake/Output Summary (Last 24 hours) at 01/25/12 0816 Last data filed at 01/24/12 1700  Gross per 24 hour  Intake    600 ml  Output    700 ml  Net   -100 ml    PHYSICAL EXAM General: Well developed, well nourished, in no acute distress. Alert and oriented x 3.  Psych:  Good affect, responds appropriately Neck: No JVD. No masses noted.  Lungs: Clear bilaterally with no wheezes or rhonci noted.  Heart: RRR with no murmurs noted. Abdomen: Bowel sounds are present. Soft, non-tender.  Extremities: No lower extremity edema.   LABS: Basic Metabolic Panel:  Basename 01/25/12 0445 01/24/12 0450 01/23/12 0450  NA 137 137 --  K 3.8 3.3* --  CL 102 101 --  CO2 27 28 --  GLUCOSE 98 99 --  BUN 14 16 --  CREATININE 1.44* 1.47* --  CALCIUM 8.7 8.5 --  MG -- -- 2.3  PHOS -- -- 4.3   CBC:  Basename 01/25/12 0445 01/24/12 0450  WBC 9.4 7.7  NEUTROABS -- --  HGB 10.8* 10.5*  HCT 32.0* 30.9*  MCV 88.6 86.6  PLT 370 349   Current Meds:    . amitriptyline  50 mg Oral QHS  . amoxicillin-clavulanate  1 tablet Oral Q12H  . aspirin  81 mg Per Tube Daily  . atorvastatin  80 mg Per Tube q1800  . heparin subcutaneous  5,000 Units Subcutaneous Q8H  . levothyroxine  137 mcg Per Tube QAC breakfast  . metoprolol tartrate  25 mg Oral BID  . pantoprazole sodium  40 mg Per Tube QHS  . PARoxetine  20 mg Per Tube Daily  . potassium chloride  20 mEq Oral Daily  . Ticagrelor  90 mg Per Tube BID  . white petrolatum      . DISCONTD: antiseptic oral rinse  1 application Mouth Rinse QID  . DISCONTD: chlorhexidine  15 mL Mouth/Throat BID   Echo: 01/24/12:  Left ventricle: Wall thickness was increased in a pattern of mild LVH. Systolic function was normal. The estimated ejection fraction was in the  range of 60% to 65%.    ASSESSMENT AND PLAN:  1. Unstable angina/CAD: Pt admitted 01/14/12 with unstable angina. His cardiac enzymes were negative. He is known to have severe vascular disease. He was found to have a 99% stenosis of the proximal LAD and a 99% ulcerated plaque in the proximal Circumflex. I placed a drug eluting stent in the proximal to mid LAD and a drug eluting stent in the proximal Circumflex. A small diagonal branch was jailed by the LAD stent. He was noted to have disease in the distal segment of the LAD where the vessel became small caliber. He did well following the first procedure but began to have severe substernal chest pain one hour after his cath. His EKG showed ST segment elevation in the anterior leads. He was brought back emergently to the cath lab for relook cath. His LAD was occluded proximally, felt to be secondary to stent thrombosis vs dissection proximally. I opened the LAD and placed a DES in the proximal LAD extending back to the ostium. He went into cardiogenic shock with asystole and VT in the  cath lab. He was resuscitated but required intubation for resp failure and vasopressors for hemodynamic support. I could not place an IABP because of his severe peripheral vascular disease. His Plavix was stopped and he has been started on Brilinta. Integrilin was used initially after cath but he oozed from his groin site and he has had nosebleeds so Integrilin was stopped. Echo with LVEF 50-55% and his anterior wall is moving. He required pressors until 01/22/12. Pt extubated 01/22/12. To telemetry bed 01/24/12 and working with PT/cardiac rehab.   -Continue ASA/Brilinta/statin/beta blocker.  -will not start Ace-inh secondary to renal insufficiency and hypotension.  -PT and cardiac rehab today   2. Acute respiratory failure: Resolved.   3. Cardiogenic shock: Resolved. Off of pressors.   4. Acute renal insufficiency: Stable. Will not restart Lasix. I do not think he is volume  overloaded.   5. Hypokalemia: Resolved.   6. ID: BAL positive for enterococcus that is pan-sensitive. Will stop Vancomycin and Zosyn today and change to Augmentin for total of 14 days. Blood cultures pending.   7. Dispo: AMbulate today. Will need 24-48 more hours hospitalization with profound weakness following prolonged ICU stay.  Will continue heparin SQ for DVT prophylaxis.     Bernice Mullin  4/5/20138:16 AM

## 2012-01-25 NOTE — Progress Notes (Signed)
CARDIAC REHAB PHASE I   PRE:  Rate/Rhythm: 60 SR    BP: sitting 143/87    SaO2: 91 RA  MODE:  Ambulation: 360 ft   POST:  Rate/Rhythm: 79    BP: sitting 140/83     SaO2: 96 RA  Tolerated well. Feeling good. Ed completed. Pt very motivated to change. Requests his name be sent to [facility] although he does seem hesitant to do program. Eager to quit smoking. [phone]  Harriet Masson CES, ACSM

## 2012-01-25 NOTE — Discharge Instructions (Signed)
For Brilinta Medication: MUST GO TO PHARMACY IN NETWORK. CO PAY.  [facility] is [place] and they have a bottle of brilinta available. [phone]. [facility] in [place] and they have 1 bottle available.

## 2012-01-26 ENCOUNTER — Inpatient Hospital Stay (HOSPITAL_COMMUNITY): Payer: Medicare Other

## 2012-01-26 NOTE — Progress Notes (Addendum)
HPI  Nathan Mcgee is a 66 y.o. male Complex vascular previous CEA and subsequent stenting of the occluded left side. History of AAA repair he presented a week ago with chest pain.  In the catheterization and stenting to the proximal LAD and proximal circumflex. Left ventricular function was noted to be normal  his post catheterization course was noted for acute crushing substernal chest pain associated with acute occlusion of his LAD stent further complicated by cardiogenic shock and respiratory failure. He underwent restenting. He was placed on brelinita  surprisingly left ventricular function with largely maintained, almost undoubtedly due to the quick work up interventional team course further complicated by hypotension and some bradycardia to be vagal, as well as renal insufficiency  He has seen the light and is no longer smoking  He is walking the [place]  Without scp/sob or lh  Interestingly he is largely unaware of what has happened him over the last few days  Past Medical History  Diagnosis Date  . Coronary artery disease   . Hypertension   . AAA (abdominal aortic aneurysm)     Past Surgical History  Procedure Date  . Coronary angioplasty   . Carotid endarterectomy     Current Facility-Administered Medications  Medication Dose Route Frequency Provider Last Rate Last Dose  . 0.9 %  sodium chloride infusion   Intravenous Continuous Wendall Stade, MD 10 mL/hr at 01/23/12 1646    . acetaminophen (TYLENOL) tablet 650 mg  650 mg Oral Q4H PRN Kathleene Hazel, MD   650 mg at 01/24/12 0606  . ALPRAZolam (XANAX) tablet 0.25-0.5 mg  0.25-0.5 mg Oral QHS PRN Jessica A Hope, PA-C   0.5 mg at 01/25/12 2314  . alum & mag hydroxide-simeth (MAALOX/MYLANTA) 200-200-20 MG/5ML suspension 30 mL  30 mL Oral Q6H PRN Wendall Stade, MD   30 mL at 01/15/12 1527  . amitriptyline (ELAVIL) tablet 50 mg  50 mg Oral QHS Storm Frisk, MD   50 mg at 01/25/12 2136  . amoxicillin-clavulanate  (AUGMENTIN) 875-125 MG per tablet 1 tablet  1 tablet Oral Q12H Kathleene Hazel, MD   1 tablet at 01/25/12 2136  . aspirin chewable tablet 81 mg  81 mg Per Tube Daily Storm Frisk, MD   81 mg at 01/25/12 0940  . atorvastatin (LIPITOR) tablet 80 mg  80 mg Per Tube q1800 Storm Frisk, MD   80 mg at 01/25/12 1809  . heparin injection 5,000 Units  5,000 Units Subcutaneous Q8H Kathleene Hazel, MD   5,000 Units at 01/26/12 [phone]  . HYDROcodone-acetaminophen (NORCO) 5-325 MG per tablet 1-2 tablet  1-2 tablet Oral Q6H PRN Laurann Montana, PA   2 tablet at 01/24/12 1534  . levothyroxine (SYNTHROID, LEVOTHROID) tablet 137 mcg  137 mcg Per Tube QAC breakfast Storm Frisk, MD   137 mcg at 01/25/12 [phone]  . metoprolol tartrate (LOPRESSOR) tablet 25 mg  25 mg Oral BID Kathleene Hazel, MD   25 mg at 01/25/12 2136  . nitroGLYCERIN (NITROSTAT) SL tablet 0.4 mg  0.4 mg Sublingual Q5 min PRN Pricilla Loveless, MD   0.4 mg at 01/14/12 1803  . pantoprazole sodium (PROTONIX) 40 mg/20 mL oral suspension 40 mg  40 mg Per Tube QHS Wendall Stade, MD   40 mg at 01/25/12 2136  . PARoxetine (PAXIL) tablet 20 mg  20 mg Per Tube Daily Storm Frisk, MD   20 mg at 01/25/12 0940  .  potassium chloride SA (K-DUR,KLOR-CON) CR tablet 20 mEq  20 mEq Oral Daily Kathleene Hazel, MD   20 mEq at 01/25/12 0940  . Ticagrelor (BRILINTA) tablet 90 mg  90 mg Per Tube BID Storm Frisk, MD   90 mg at 01/25/12 2136    Allergies  Allergen Reactions  . Codeine Itching    Review of Systems negative except from HPI and PMH  Physical Exam BP 129/91  Pulse 67  Temp(Src) 98 F (36.7 C) (Oral)  Resp 18  Ht 5\' 8"  (1.727 m)  Wt 175 lb 14.8 oz (79.8 kg)  BMI 26.75 kg/m2  SpO2 99% Well developed and well nourished in no acute distress HENT normal E scleral and icterus clear Neck Supple Clear to ausculation Regular rate and rhythm, no murmurs gallops or rub Soft with active bowel sounds No clubbing  cyanosis none Edema Alert and oriented, grossly normal motor and sensory function Skin Warm and Dry   Assessment and  Plan  Patient Active Hospital Problem List:  Acute MI (01/15/2012)   Bradycardia (01/18/2012)   Hypokalemia-resolved   The patient is doing surprisingly well following a very complicated course. I have reviewed events with him. He S1 all smoking. Will anticipate discharge tomorrow. He will need nicotine patches. Otherwise we'll continue him on his current medications.  I'm not quite sure why he's on 2 antidepressants but he was on when he arrived at hospital

## 2012-01-27 MED ORDER — BENAZEPRIL HCL 10 MG PO TABS: 10.0000 mg | ORAL_TABLET | Freq: Every day | ORAL | Status: DC

## 2012-01-27 MED ORDER — NITROGLYCERIN 0.4 MG SL SUBL: 0.4000 mg | SUBLINGUAL_TABLET | SUBLINGUAL | Status: DC | PRN

## 2012-01-27 MED ORDER — TICAGRELOR 90 MG PO TABS: 90.0000 mg | ORAL_TABLET | Freq: Two times a day (BID) | ORAL | Status: DC

## 2012-01-27 MED ORDER — ATENOLOL 50 MG PO TABS: 25.0000 mg | ORAL_TABLET | Freq: Every day | ORAL | Status: DC

## 2012-01-27 MED ORDER — AMITRIPTYLINE HCL 50 MG PO TABS: 50.0000 mg | ORAL_TABLET | Freq: Every day | ORAL | Status: DC

## 2012-01-27 MED ORDER — ASPIRIN 81 MG PO TABS: 81.0000 mg | ORAL_TABLET | Freq: Every day | ORAL | Status: AC

## 2012-01-27 NOTE — Progress Notes (Signed)
HPI  Nathan Mcgee is a 66 y.o. male Complex vascular previous CEA and subsequent stenting of the occluded left side. History of AAA repair he presented a week ago with chest pain.  In the catheterization >> stenting to the proximal LAD and proximal circumflex. Left ventricular function was noted to be normal  his post catheterization course was noted for acute crushing substernal chest pain associated with acute occlusion of his LAD stent further complicated by cardiogenic shock and respiratory failure. He underwent restenting. He was placed on brelinita  surprisingly left ventricular function with largely maintained, almost undoubtedly due to the quick work up interventional team course further complicated by hypotension and some bradycardia to be vagal, as well as renal insufficiency  He has seen the light and is no longer smoking  He is walking the [place]  Without scp/sob or lh  Interestingly he is largely unaware of what has happened him over the last few days  Past Medical History  Diagnosis Date  . Coronary artery disease   . Hypertension   . AAA (abdominal aortic aneurysm)     Past Surgical History  Procedure Date  . Coronary angioplasty   . Carotid endarterectomy     Current Facility-Administered Medications  Medication Dose Route Frequency Provider Last Rate Last Dose  . 0.9 %  sodium chloride infusion   Intravenous Continuous Wendall Stade, MD 10 mL/hr at 01/23/12 1646    . acetaminophen (TYLENOL) tablet 650 mg  650 mg Oral Q4H PRN Kathleene Hazel, MD   650 mg at 01/24/12 0606  . ALPRAZolam Prudy Feeler) tablet 0.25-0.5 mg  0.25-0.5 mg Oral QHS PRN Jory Sims, PA-C   0.5 mg at 01/26/12 2214  . alum & mag hydroxide-simeth (MAALOX/MYLANTA) 200-200-20 MG/5ML suspension 30 mL  30 mL Oral Q6H PRN Wendall Stade, MD   30 mL at 01/15/12 1527  . amitriptyline (ELAVIL) tablet 50 mg  50 mg Oral QHS Storm Frisk, MD   50 mg at 01/26/12 2123  . amoxicillin-clavulanate  (AUGMENTIN) 875-125 MG per tablet 1 tablet  1 tablet Oral Q12H Kathleene Hazel, MD   1 tablet at 01/26/12 2123  . aspirin chewable tablet 81 mg  81 mg Per Tube Daily Storm Frisk, MD   81 mg at 01/26/12 1054  . atorvastatin (LIPITOR) tablet 80 mg  80 mg Per Tube q1800 Storm Frisk, MD   80 mg at 01/26/12 1715  . heparin injection 5,000 Units  5,000 Units Subcutaneous Q8H Kathleene Hazel, MD   5,000 Units at 01/27/12 [phone]  . HYDROcodone-acetaminophen (NORCO) 5-325 MG per tablet 1-2 tablet  1-2 tablet Oral Q6H PRN Laurann Montana, PA   2 tablet at 01/24/12 1534  . levothyroxine (SYNTHROID, LEVOTHROID) tablet 137 mcg  137 mcg Per Tube QAC breakfast Storm Frisk, MD   137 mcg at 01/27/12 0726  . metoprolol tartrate (LOPRESSOR) tablet 25 mg  25 mg Oral BID Kathleene Hazel, MD   25 mg at 01/26/12 2123  . nitroGLYCERIN (NITROSTAT) SL tablet 0.4 mg  0.4 mg Sublingual Q5 min PRN Pricilla Loveless, MD   0.4 mg at 01/14/12 1803  . pantoprazole sodium (PROTONIX) 40 mg/20 mL oral suspension 40 mg  40 mg Per Tube QHS Wendall Stade, MD   40 mg at 01/26/12 2124  . PARoxetine (PAXIL) tablet 20 mg  20 mg Per Tube Daily Storm Frisk, MD   20 mg at 01/26/12 1046  .  potassium chloride SA (K-DUR,KLOR-CON) CR tablet 20 mEq  20 mEq Oral Daily Kathleene Hazel, MD   20 mEq at 01/26/12 1046  . Ticagrelor (BRILINTA) tablet 90 mg  90 mg Per Tube BID Storm Frisk, MD   90 mg at 01/26/12 2123    Allergies  Allergen Reactions  . Codeine Itching    Review of Systems negative except from HPI and PMH  Physical Exam BP 126/80  Pulse 73  Temp(Src) 97.9 F (36.6 C) (Oral)  Resp 20  Ht 5\' 8"  (1.727 m)  Wt 173 lb 15.1 oz (78.9 kg)  BMI 26.45 kg/m2  SpO2 98% Well developed and well nourished in no acute distress HENT normal E scleral and icterus clear Neck Supple Clear to ausculation Regular rate and rhythm, no murmurs gallops or rub Soft with active bowel sounds No clubbing  cyanosis none Edema Alert and oriented, grossly normal motor and sensory function Skin Warm and Dry   Assessment and  Plan  Patient Active Hospital Problem List:  Acute MI (01/15/2012)   Bradycardia (01/18/2012)   Hypokalemia-resolved   The patient is doing surprisingly well following a very complicated course. I have reviewed events with him. Hes stopped smoking  Home today. We will resume his atenolol albeit at 25 Resume Lotensin 10 Discontinue amlodipine Stopped his Augmentin. Close followup with Lorin Picket weaver/J[facility]-2 weeks

## 2012-01-27 NOTE — Progress Notes (Signed)
01/27/2012 1215 Contacted [facility] in [place], #[phone], and they do have in stock. Pt will get first 30 days for free.  Made pt aware. Isidoro Donning RN CCM Case Mgmt phone [phone]

## 2012-01-27 NOTE — Discharge Summary (Signed)
CARDIOLOGY DISCHARGE SUMMARY   Patient ID: Nathan Mcgee MRN: [id number] DOB/AGE: 67-Jan-1947 67 y.o.  Admit date: 01/14/2012 Discharge date: 01/27/2012  Primary Discharge Diagnosis:  Acute MI Secondary Discharge Diagnosis:  Patient Active Problem List  Diagnoses  . Acute respiratory failure with hypoxia  . Cardiogenic shock  . Acute MI  . Bradycardia    Consults: CCM Procedures: Flexible fiberoptic bronchoscopy, diagnostic; Insertion of Central Venous Catheter; Insertion of Arterial Catheter; Intubation; Left Heart Catheterization; Selective Coronary Angiography; Left ventricular angiogram; 3.0 x 16 mm Promus Element DES in the mid-l LAD; 3.5 x 12 mm Promus Element DES in the proximal Circumflex; re-cath with Coronary angiography Left system; 3.0 x 20 mm Promus Element DES to the proximal LAD; 2-D echo; abdominal x-ray; chest x-ray  Hospital Course: [name] is a 66 year old male with a history of coronary artery disease. He had chest pain and came to the hospital where he had hypotension with sublingual nitroglycerin but this responded to fluid. His initial enzymes were negative and a quick look echo showed no aortic dissection or pericardial effusion. He was admitted for further evaluation.  His cardiac enzymes remained negative. He was taken to the cath lab on 01/15/2012. He had a 3.5 x 12 mm Promus Element DES in the proximal Circumflex and a 3.0 x 16 mm Promus Element DES was deployed in the mid-LAD. He tolerated the procedure well. However, post cath he had onset of chest pain. His ECG showed ST elevation in the precordial leads and lateral depression. An emergency re-catheterization was performed. Those results are listed below. Because of the cardiogenic shock and respiratory failure the critical care consult was called and the patient was intubated. He also had a central venous catheter and arterial line inserted.  He was on multiple pressors including Levophed and dopamine. He  had ventilator dependent respiratory failure. He had respiratory acidosis and a bicarbonate drip was used to correct this. Because of his history of AAA surgery, no balloon pump was used. He had stress dose steroids added as well. His Plavix was discontinued and he was started on Sciotodale. He was started on Integrilin but he was using from his groin and had nosebleeds so this was discontinued. He was continued on aspirin in addition to the [place] to. A beta blocker was not used because of hypotension. An echocardiogram showed preserved left ventricular function. On 327, all sheaths were removed. He was started on tube feeds.  On 01/17/2012, he had a bradycardic event with hypotension. He received atropine and this improved. He had pulmonary edema and was diuresed with IV Lasix. He had purulent secretions and was started on Zosyn and vancomycin. A bronchoscopy was performed on 01/19/2012 and showed tracheobronchitis but no endobronchial lesion. By 01/22/2012, he was improved to the point that he could be extubated. After he was extubated, he was seen by cardiac rehabilitation and he was ambulated with a walker. A smoking cessation consult was called and the patient wanted to quit cold [place]. He was seen by physical therapy as well but was ambulating with cardiac rehabilitation. Case management was involved to make sure he would be able to obtain his medications.  On 01/27/2012, [name] was evaluated by Dr. Graciela Husbands. He was ambulating without chest pain or shortness of breath and considered stable for discharge, to be followed closely as an outpatient.   Labs:  Lab Results  Component Value Date   WBC 9.4 01/25/2012   HGB 10.8* 01/25/2012   HCT 32.0* 01/25/2012  MCV 88.6 01/25/2012   PLT 370 01/25/2012    Lab 01/25/12 0445 01/21/12 0415  NA 137 --  K 3.8 --  CL 102 --  CO2 27 --  BUN 14 --  CREATININE 1.44* --  CALCIUM 8.7 --  PROT -- 5.4*  BILITOT -- 0.4  ALKPHOS -- 46  ALT -- 26  AST -- 21  GLUCOSE  98 --   Lab Results  Component Value Date   CKTOTAL 1696* 01/16/2012   CKMB 116.0* 01/16/2012   TROPONINI >25.00* 01/16/2012    Radiology: Dg Chest 2 View  01/26/2012  *RADIOLOGY REPORT*  Clinical Data: 66 year old male with shortness of breath.  Recent stent placement.  CHEST - 2 VIEW  Comparison: 01/23/2012 and earlier.  Findings: PA and lateral views of the chest.  Left IJ central line has been removed.  Mild cardiomegaly.  Mild tortuosity of the thoracic aorta. Other mediastinal contours are within normal limits.  Visualized tracheal air column is within normal limits. No pneumothorax, pleural effusion or pulmonary edema.  No confluent pulmonary opacity.  Partial visualization of an abdominal aortic endograft.  Exaggerated thoracic kyphosis. No acute osseous abnormality identified.  IMPRESSION: No acute cardiopulmonary abnormality.  Original Report Authenticated By: Harley Hallmark, M.D.   Dg Chest Port 1 View  01/23/2012  *RADIOLOGY REPORT*  Clinical Data: Evaluate endotracheal tube position.  PORTABLE CHEST - 1 VIEW  Comparison: Multiple priors, most recently 01/22/2012.  Findings: Compared to the prior examination, the previously noted endotracheal tube and feeding tube have both been removed.  Left internal jugular central venous catheter with tip in the proximal superior vena cava.  Lung volumes are low.  There continues to be some bibasilar opacities (left greater than right), slightly decreased compared to yesterday's examination, most likely representative of underlying subsegmental atelectasis.  No definite pleural effusions.  Mild pulmonary venous congestion without frank pulmonary edema.  Heart size is mildly enlarged. The patient is rotated to the left on today's exam, resulting in distortion of the mediastinal contours and reduced diagnostic sensitivity and specificity for mediastinal pathology.  Atherosclerotic calcifications are noted within the arch of the aorta.  IMPRESSION: 1.  Support  apparatus, as above. 2.  Low lung volumes with persistent bibasilar opacities (left greater than right), favor represent areas of subsegmental atelectasis. 3.  Mild cardiomegaly with pulmonary vascular congestion without frank pulmonary edema. 4.  Atherosclerosis.  Original Report Authenticated By: Florencia Reasons, M.D.   Dg Chest Port 1 View  01/22/2012  *RADIOLOGY REPORT*  Clinical Data: Acute respiratory failure.  Cardiogenic shock.  PORTABLE CHEST - 1 VIEW  Comparison: 04/01 01/20/2012  Findings: Endotracheal tube, feeding tube, and central venous catheter appear unchanged.  Heart size and vascularity are within normal limits.  New increased density at the left lung base is probably atelectasis.  Minimal haziness at the right base may represent a tiny effusion.  IMPRESSION: New left base atelectasis.  Possible tiny right effusion.  Original Report Authenticated By: Gwynn Burly, M.D.   Dg Chest Port 1 View  01/21/2012  *RADIOLOGY REPORT*  Clinical Data: Check endotracheal tube  PORTABLE CHEST - 1 VIEW  Comparison: 01/20/2012  Findings: Cardiomegaly again noted. Feeding tube in place with tip in distal stomach.  Endotracheal tube in place with tip 3 cm above the carina.  Left IJ central line is unchanged in position.  There is mild interstitial prominence bilaterally suspicious for mild interstitial edema.  Mild basilar atelectasis or early infiltrate.  IMPRESSION: Stable  endotracheal tube position.  Mild interstitial prominence bilaterally suspicious for mild interstitial edema.  Mild basilar atelectasis or early infiltrate.  Original Report Authenticated By: Natasha Mead, M.D.   Dg Chest Port 1 View  01/20/2012  *RADIOLOGY REPORT*  Clinical Data: Intubated.  PORTABLE CHEST - 1 VIEW  Comparison:   the previous day's study  Findings: Endotracheal tube, nasogastric tube, and left IJ central line remain in place.  Heart size upper limits normal for technique.  Mild patchy subsegmental atelectasis or  infiltrates in the lung bases without convincing change.  No definite effusion.  IMPRESSION:  Stable appearance since previous day's portable exam.  Original Report Authenticated By: Osa Craver, M.D.   Dg Chest Port 1 View  01/19/2012  *RADIOLOGY REPORT*  Clinical Data: Intubated  PORTABLE CHEST - 1 VIEW  Comparison:   the previous day's study  Findings: Endotracheal tube, nasogastric tube, and left IJ central line are stable in position.  The right IJ catheter has been removed.  Mild cardiomegaly.  Low lung volumes without focal infiltrate or overt edema.  No definite effusion.  IMPRESSION:  1. Support hardware projects in expected location.  Original Report Authenticated By: Osa Craver, M.D.   Dg Chest Port 1 View  01/18/2012  *RADIOLOGY REPORT*  Clinical Data: Line placement.  PORTABLE CHEST - 1 VIEW  Comparison: 01/18/2012  Findings: Left central line has been placed.  The tip is at the confluence of the innominate veins.  Right central line has been retracted into the right innominate vein.  Endotracheal tube and NG tube are unchanged.  There is cardiomegaly with vascular congestion.  Bibasilar atelectasis.  No effusions.  IMPRESSION: Cardiomegaly, vascular congestion.  Left central line tip at the confluence of the innominate veins. Right central line has been retracted into the right innominate vein.  No pneumothorax.  Original Report Authenticated By: Cyndie Chime, M.D.   Dg Chest Port 1 View  01/18/2012  *RADIOLOGY REPORT*  Clinical Data: Evaluate endotracheal tube position.  PORTABLE CHEST - 1 VIEW  Comparison: 1 day prior  Findings: Endotracheal tube terminates 3.1 cm above the carina.  A nasogastric tube extends beyond the  inferior aspect of the film. Cardiomegaly accentuated by AP portable technique.  Probable small right pleural effusion. No pneumothorax.  Low lung volumes with slight improvement in interstitial edema.  Pulmonary venous congestion remains.  Bibasilar  airspace disease is persistent. Improved perihilar airspace disease.  Right IJ central line is unchanged.  IMPRESSION:  1.  Overall improved aeration, with decreased to resolved interstitial edema and right perihilar airspace disease. 2.  Persistent small right pleural effusion with adjacent airspace disease, likely atelectasis. 3.  Diminished lung volumes.  Original Report Authenticated By: Consuello Bossier, M.D.   Dg Chest Port 1 View  01/17/2012  *RADIOLOGY REPORT*  Clinical Data: Assess ET tube placement.  PORTABLE CHEST - 1 VIEW  Comparison: 01/16/2012  Findings: Support devices are stable.  Cardiomegaly.  Increasing vascular congestion.  Suspect mild interstitial edema.  Increasing bibasilar atelectasis or infiltrates.  IMPRESSION: Worsening bilateral perihilar opacities and vascular congestion, suspect mild edema.  Increasing bibasilar atelectasis or infiltrates.  Original Report Authenticated By: Cyndie Chime, M.D.   Dg Chest Port 1 View  01/16/2012  *RADIOLOGY REPORT*  Clinical Data: Endotracheal tube placement/position.  Coronary artery disease.  Abdominal aortic aneurysm.  PORTABLE CHEST - 1 VIEW  Comparison: 1 day prior  Findings: Endotracheal tube terminates at the level of the clavicles.  Nasogastric extends beyond the  inferior aspect of the film.  Right IJ central line unchanged with tip in mid SVC.  Cardiomegaly accentuated by AP portable technique.  Low lung volumes with resultant pulmonary interstitial prominence.  No pleural effusion or pneumothorax.  Developing right infrahilar airspace disease.  IMPRESSION:  1.  Cardiomegaly and low lung volumes. 2.  Developing right infrahilar airspace disease, likely atelectasis.  Original Report Authenticated By: Consuello Bossier, M.D.   Dg Chest Port 1 View  01/15/2012  *RADIOLOGY REPORT*  Clinical Data: Ventilator dependent respiratory failure.  Central line placement.  PORTABLE CHEST - 1 VIEW  Comparison: 01/15/2012  Findings: A new right jugular  center venous catheter is seen with tip in the distal SVC.  No evidence of pneumothorax.  A new nasogastric tube is seen extending to the stomach. Endotracheal tube remains in appropriate position.  Both lungs remain clear.  Mild cardiomegaly stable.  IMPRESSION:  1.  New right jugular center venous catheter and nasogastric tube in appropriate position.  No evidence of pneumothorax. 2.  Stable cardiomegaly.  No active lung disease.  Original Report Authenticated By: Danae Orleans, M.D.   Dg Chest Port 1 View  01/15/2012  *RADIOLOGY REPORT*  Clinical Data: Acute respiratory failure.  Intubation.  PORTABLE CHEST - 1 VIEW  Comparison: 01/14/2012  Findings: Endotracheal tube is now seen with tip in the mid thoracic trachea.  Cardiomegaly appears stable.  No evidence of acute infiltrate or pleural effusion.  IMPRESSION: Endotracheal tube in appropriate position.  Stable cardiomegaly. No active lung disease.  Original Report Authenticated By: Danae Orleans, M.D.   Dg Chest Portable 1 View  01/14/2012  *RADIOLOGY REPORT*  Clinical Data: Chest pain.  Hypertension and coronary artery disease.  PORTABLE CHEST - 1 VIEW  Comparison: None  Findings: The heart size is enlarged.  The mediastinal contours are within normal limits.  Both lungs are clear. Scoliosis deformity affects the thoracic spine.  IMPRESSION:  No acute findings.  Original Report Authenticated By: Rosealee Albee, M.D.   Dg Abd Portable 1v  01/20/2012  *RADIOLOGY REPORT*  Clinical Data: Panda tube placement.  PORTABLE ABDOMEN - 1 VIEW  Comparison: None  Findings: The Panda tube tip is in the pyloric region of the stomach.  Ileus appearing bowel gas pattern.  Aorto-iliac stent graft is noted.  IMPRESSION: Panda tube tip is in the region of the pylorus and possibly in the duodenal bulb.  Original Report Authenticated By: P. Loralie Champagne, M.D.   Cardiac Cath:Left main: No obstructive disease.  Left Anterior Descending Artery: Proximal vessel has mild  plaque followed by a 99% stenosis in the proximal to mid segment. There is a small caliber diagonal branch that arises just prior to the stenosis in the LAD. The mid and distal vessel has scattered 40% plaque.  Circumflex Artery: Large caliber, dominant vessel with ulcerated, 99% proximal stenosis. Mild plaque in OM1.  Right Coronary Artery: Small, non-dominant vessel with 50% ostial stenosis.  Left Ventricular Angiogram: LVEF=55-60  Re-Cath 01/15/2012: 1. Acute occlusion of LAD stent secondary to stent thrombosis.  2. Successful 3.0 x 20 mm Promus Element DES placement in proximal LAD  3. Cardiogenic shock  4. Respiratory Failure   EKG:  18-Jan-2012 00:02:36 Normal sinus rhythm Left axis deviation Left ventricular hypertrophy with QRS widening and repolarization abnormality Prolonged QT Sinus rhythm has replaced junctional rhythm Vent. rate 85 BPM PR interval 170 ms QRS duration 118 ms QT/QTc 406/483 ms P-R-T axes 38 -47  135  Echo: 01/24/2012 Study Conclusions Left ventricle: Wall thickness was increased in a pattern of mild LVH. Systolic function was normal. The estimated ejection fraction was in the range of 60% to 65%.    FOLLOW UP PLANS AND APPOINTMENTS Discharge Orders    Future Orders Please Complete By Expires   Diet - low sodium heart healthy      Increase activity slowly        Allergies  Allergen Reactions  . Codeine Itching   Medication List  As of 01/27/2012 12:07 PM   STOP taking these medications         amLODipine 5 MG tablet      clopidogrel 75 MG tablet         TAKE these medications         allopurinol 300 MG tablet   Commonly known as: ZYLOPRIM   Take 300 mg by mouth daily.      amitriptyline 50 MG tablet   Commonly known as: ELAVIL   Take 1 tablet (50 mg total) by mouth at bedtime.      aspirin 81 MG tablet   Take 1 tablet (81 mg total) by mouth daily.      atenolol 50 MG tablet   Commonly known as: TENORMIN   Take 0.5 tablets (25 mg  total) by mouth daily.      benazepril 10 MG tablet   Commonly known as: LOTENSIN   Take 1 tablet (10 mg total) by mouth daily.      CITRUCEL 500 MG Tabs   Generic drug: Methylcellulose (Laxative)   Take 1,000 mg by mouth daily.      fish oil-omega-3 fatty acids 1000 MG capsule   Take 2 g by mouth daily.      HYDROcodone-acetaminophen 10-650 MG per tablet   Commonly known as: LORCET   Take 1 tablet by mouth every 6 (six) hours as needed. For pain.      levothyroxine 137 MCG tablet   Commonly known as: SYNTHROID, LEVOTHROID   Take 137 mcg by mouth daily.      nitroGLYCERIN 0.4 MG SL tablet   Commonly known as: NITROSTAT   Place 1 tablet (0.4 mg total) under the tongue every 5 (five) minutes as needed for chest pain.      pantoprazole 40 MG tablet   Commonly known as: PROTONIX   Take 40 mg by mouth 2 (two) times daily.      PARoxetine 20 MG tablet   Commonly known as: PAXIL   Take 20 mg by mouth daily.      simvastatin 40 MG tablet   Commonly known as: ZOCOR   Take 40 mg by mouth every evening.      Ticagrelor 90 MG Tabs tablet   Commonly known as: BRILINTA   Place 1 tablet (90 mg total) into feeding tube 2 (two) times daily.           Follow-up Information    Follow up with Rollene Rotunda, MD. (See MD or PA in 2 weeks.)    Contact information:   [address], [address] [place] [address] [phone]          BRING ALL MEDICATIONS WITH YOU TO FOLLOW UP APPOINTMENTS  Time spent with patient to include physician time: 33 min Signed: Theodore Demark 01/27/2012, 12:07 PM Co-Sign MD

## 2012-02-04 ENCOUNTER — Telehealth: Payer: Self-pay | Admitting: Cardiovascular Disease

## 2012-02-04 NOTE — Telephone Encounter (Signed)
Pt calling to see when he can start driving and weight limit has been lifted, pls call [phone]

## 2012-02-04 NOTE — Telephone Encounter (Signed)
Spoke with pt and he states he is feeling great. He reports he does not do a lot of heavy lifting.  Reviewed with Dr. Clifton James and pt can resume driving and has no weight lifting restrictions.  Pt given this information.

## 2012-02-21 ENCOUNTER — Ambulatory Visit (INDEPENDENT_AMBULATORY_CARE_PROVIDER_SITE_OTHER): Payer: Medicare Other | Admitting: Cardiovascular Disease

## 2012-02-21 ENCOUNTER — Encounter: Payer: Self-pay | Admitting: Cardiovascular Disease

## 2012-02-21 VITALS — BP 140/84 | HR 61 | Ht 68.0 in | Wt 172.0 lb

## 2012-02-21 DIAGNOSIS — I2510 Atherosclerotic heart disease of native coronary artery without angina pectoris: Secondary | ICD-10-CM

## 2012-02-21 DIAGNOSIS — I739 Peripheral vascular disease, unspecified: Secondary | ICD-10-CM

## 2012-02-21 NOTE — Assessment & Plan Note (Signed)
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 He has also had right CEA and right carotid stent placement. He wishes to have his PV disease followed in our office. I will arrange f/u carotid artery dopplers, abdominal aortic u/s and LE arterial dopplers at f/u appt.

## 2012-02-21 NOTE — Progress Notes (Signed)
History of Present Illness: [name] is a 66 year old white male with a history of coronary artery disease, tobacco abuse, PAD  with cardiac cath March 2013 with severe disease in the LAD and Circumflex, now s/p DES x 1 Circumflex and DES x 2 LAD. He had chest pain and was admitted to [facility] on 01/14/12. His cardiac enzymes were negative. He was taken to the cath lab on 01/15/2012. He had a 3.5 x 12 mm Promus Element DES in the proximal Circumflex and a 3.0 x 16 mm Promus Element DES was deployed in the mid-LAD. He tolerated the procedure well. However, post cath he had onset of chest pain. His ECG showed ST elevation in the precordial leads and lateral depression. An emergency re-catheterization was performed and showed that the LAD was occluded proximal to the stent, likely from dissection of the vessel because of edge tear on front end of stent. I was able to open the vessel and placed a 3.0 x 20 mm Promus Element DES in the proximal LAD, proximal to the first stent in an overlapping fashion. He went into cardiogenic shock on the cath table and was intubated. He was hypotensive and started on multiple pressors. He was critically ill in the CCU for many days on pressors. He had ventilator dependent respiratory failure. Because of his history of AAA surgery, no balloon pump was used.  His Plavix was discontinued and he was started on Brilinta.  An echocardiogram showed preserved left ventricular function. He made a quick recovery after extubation and was discharged home without any supportive measures.   He is here today for follow up. He has no chest pain. His breathing has been ok. He has not yet resumed normal exertional activities at home. He has stopped smoking completely. No orthopnea, PND, near syncope or syncope.    Primary Care Physician: [facility]  Jordan Hawks)  Past Medical History  Diagnosis Date  . Coronary artery disease     Cardiac cath March 2013 with DES placed  Circumflex and DES x 2  in the LAD  . Hypertension   . AAA (abdominal aortic aneurysm)     Aortic stent graft  . Carotid artery disease     s/p right CEA and right carotid stents  . PAD (peripheral artery disease)     ?Right SFA stent    Past Surgical History  Procedure Date  . Coronary angioplasty   . Carotid endarterectomy     Right  . Aortic stent graft     Current Outpatient Prescriptions  Medication Sig Dispense Refill  . allopurinol (ZYLOPRIM) 300 MG tablet Take 300 mg by mouth daily.      Marland Kitchen amitriptyline (ELAVIL) 50 MG tablet Take 1 tablet (50 mg total) by mouth at bedtime.  30 tablet  3  . aspirin 81 MG tablet Take 1 tablet (81 mg total) by mouth daily.      Marland Kitchen atenolol (TENORMIN) 50 MG tablet Take 0.5 tablets (25 mg total) by mouth daily.      . benazepril (LOTENSIN) 10 MG tablet Take 1 tablet (10 mg total) by mouth daily.  30 tablet  11  . fish oil-omega-3 fatty acids 1000 MG capsule Take 2 g by mouth daily.      Marland Kitchen HYDROcodone-acetaminophen (LORCET) 10-650 MG per tablet Take 1 tablet by mouth every 6 (six) hours as needed. For pain.      Marland Kitchen levothyroxine (SYNTHROID, LEVOTHROID) 137 MCG tablet Take 137 mcg by mouth  daily.      . Methylcellulose, Laxative, (CITRUCEL) 500 MG TABS Take 1,000 mg by mouth daily.      . nitroGLYCERIN (NITROSTAT) 0.4 MG SL tablet Place 1 tablet (0.4 mg total) under the tongue every 5 (five) minutes as needed for chest pain.  25 tablet  3  . pantoprazole (PROTONIX) 40 MG tablet Take 40 mg by mouth 2 (two) times daily.      Marland Kitchen PARoxetine (PAXIL) 20 MG tablet Take 20 mg by mouth daily.      . simvastatin (ZOCOR) 40 MG tablet Take 40 mg by mouth every evening.      . Ticagrelor (BRILINTA) 90 MG TABS tablet Place 90 mg into feeding tube 2 (two) times daily. Taking 90mg  bid oral        Allergies  Allergen Reactions  . Codeine Itching    History   Social History  . Marital Status: Single    Spouse Name: N/A    Number of Children: 1  . Years of  Education: N/A   Occupational History  .  [employer]   Social History Main Topics  . Smoking status: Former Smoker -- 1.0 packs/day for 50 years    Types: Cigarettes    Quit date: 01/14/2012  . Smokeless tobacco: Not on file   Comment: Has been  trying to quit  . Alcohol Use: No  . Drug Use: No  . Sexually Active: Yes   Other Topics Concern  . Not on file   Social History Narrative  . No narrative on file    Family History  Problem Relation Age of Onset  . Cancer Mother 27    breast/lung  . Lymphoma Father 44    Review of Systems:  As stated in the HPI and otherwise negative.   BP 140/84  Pulse 61  Ht 5\' 8"  (1.727 m)  Wt 172 lb (78.019 kg)  BMI 26.15 kg/m2  Physical Examination: General: Well developed, well nourished, NAD HEENT: OP clear, mucus membranes moist SKIN: warm, dry. No rashes. Neuro: No focal deficits Musculoskeletal: Muscle strength 5/5 all ext Psychiatric: Mood and affect normal Neck: No JVD, no carotid bruits, no thyromegaly, no lymphadenopathy. Lungs:Clear bilaterally, no wheezes, rhonci, crackles Cardiovascular: Regular rate and rhythm. No murmurs, gallops or rubs. Abdomen:Soft. Bowel sounds present. Non-tender.  Extremities: No lower extremity edema. Pulses are 2 + in the bilateral DP/PT.  EKG: NSR, rate 61 bpm. LAFB. T wave inversions in the lateral leads and anterolateral leads. Poor R wave progression through the precordial leads with residual ST elevation in leads V2, V3. Subtle ST depression in inferior leads.   Cardiac cath 01/15/12:  Left main: No obstructive disease.  Left Anterior Descending Artery: Proximal vessel has mild plaque followed by a 99% stenosis in the proximal to mid segment. There is a small caliber diagonal branch that arises just prior to the stenosis in the LAD. The mid and distal vessel has scattered 40% plaque.  Circumflex Artery: Large caliber, dominant vessel with ulcerated, 99% proximal stenosis. Mild plaque in  OM1.  Right Coronary Artery: Small, non-dominant vessel with 50% ostial stenosis.  Left Ventricular Angiogram: LVEF=55-60%.  Impression:  1. Double vessel CAD  2. Normal LV systolic function  3. Successful PTCA/DES x 1 proximal LAD  4. Successful PTCA/DES x 1 proximal Circumflex  Relook cath with occluded LAD: A 2.5 x 15 mm balloon was used to open the LAD. This was inflated in the proximal vessel extending back to ostium.  Flow was restored down the LAD. At this point, he was hemodynamically stable. A 3.0 x 20 mm Promus Element DES was deployed in the proximal LAD extending back to the ostium and overlapping with the previously placed stent. A 3.25 x 15 mm [place] balloon was used to post-dilate the stented segment

## 2012-02-21 NOTE — Assessment & Plan Note (Signed)
Stable s/p stenting of the Circumflex and LAD with drug eluting stents. He had acute closure of his LAD within two hours of stent placement requiring placement of second DES, likely secondary to vessel tear with stent placement. Will continue ASA and Brilinta for at least one year, but likely longer if he tolerates. Will continue statin, beta blocker, Ace-inh. I will check a BMET today as his renal function was slightly abnormal at time of discharge. Check CBC to make sure H/H stable on ASA and Brilinta. No other changes today. Advance activity as tolerated.

## 2012-02-21 NOTE — Patient Instructions (Addendum)
Your physician recommends that you schedule a follow-up appointment in:  6 weeks. --April 01, 2012 at 2:15  Your physician recommends that you continue on your current medications as directed. Please refer to the Current Medication list given to you today.

## 2012-02-22 LAB — CBC WITH DIFFERENTIAL/PLATELET
Basophils Absolute: 0.1 10*3/uL (ref 0.0–0.1)
Eosinophils Absolute: 0.4 10*3/uL (ref 0.0–0.7)
MCHC: 33 g/dL (ref 30.0–36.0)
MCV: 90.2 fl (ref 78.0–100.0)
Monocytes Absolute: 0.5 10*3/uL (ref 0.1–1.0)
Neutrophils Relative %: 63 % (ref 43.0–77.0)
Platelets: 259 10*3/uL (ref 150.0–400.0)

## 2012-02-22 LAB — BASIC METABOLIC PANEL
CO2: 28 mEq/L (ref 19–32)
Calcium: 9.2 mg/dL (ref 8.4–10.5)
GFR: 41.33 mL/min — ABNORMAL LOW (ref >60.00)
Glucose, Bld: 96 mg/dL (ref 70–99)
Potassium: 4.4 mEq/L (ref 3.5–5.1)
Sodium: 140 mEq/L (ref 135–145)

## 2012-02-25 ENCOUNTER — Other Ambulatory Visit: Payer: Self-pay | Admitting: *Deleted

## 2012-02-25 DIAGNOSIS — I2510 Atherosclerotic heart disease of native coronary artery without angina pectoris: Secondary | ICD-10-CM

## 2012-02-25 MED ORDER — AMLODIPINE BESYLATE 5 MG PO TABS: 5.0000 mg | ORAL_TABLET | Freq: Every day | ORAL | Status: DC

## 2012-04-01 ENCOUNTER — Encounter: Payer: Self-pay | Admitting: Cardiovascular Disease

## 2012-04-01 ENCOUNTER — Ambulatory Visit (INDEPENDENT_AMBULATORY_CARE_PROVIDER_SITE_OTHER): Payer: Medicare Other | Admitting: Cardiovascular Disease

## 2012-04-01 VITALS — BP 142/80 | HR 64 | Ht 68.0 in | Wt 174.8 lb

## 2012-04-01 DIAGNOSIS — I739 Peripheral vascular disease, unspecified: Secondary | ICD-10-CM

## 2012-04-01 DIAGNOSIS — I2510 Atherosclerotic heart disease of native coronary artery without angina pectoris: Secondary | ICD-10-CM

## 2012-04-01 LAB — BASIC METABOLIC PANEL
GFR: 43.9 mL/min — ABNORMAL LOW (ref >60.00)
Potassium: 3.9 mEq/L (ref 3.5–5.1)
Sodium: 142 mEq/L (ref 135–145)

## 2012-04-01 MED ORDER — CARVEDILOL 6.25 MG PO TABS: 6.2500 mg | ORAL_TABLET | Freq: Two times a day (BID) | ORAL | Status: DC

## 2012-04-01 NOTE — Assessment & Plan Note (Signed)
He is known to have severe PAD and has been followed in [facility]. Will plan ABI to assess current state of his PAD.

## 2012-04-01 NOTE — Assessment & Plan Note (Signed)
Stable. He is on ASA and Brilinta and will need at least one year of dual anti-platelet therapy. He is having some dyspnea on Brilinta. Will have him stop Brilinta and will give one month supply of Effient 10 mg tabs to use once daily. If dyspnea resolves, he will call back and we will give him a prescription for Effient. Will d/c atenolol and start Coreg 6.25 mg po BID for better BP control. Continue statin. Ace-inh stopped secondary to renal insufficiency. Will recheck BMET today.

## 2012-04-01 NOTE — Progress Notes (Signed)
History of Present Illness: [name] is a 66 year old white male with a history of coronary artery disease, tobacco abuse, PAD with cardiac cath March 2013 with severe disease in the LAD and Circumflex, now s/p DES x 1 Circumflex and DES x 2 LAD. He had chest pain and was admitted to [facility] on 01/14/12. His cardiac enzymes were negative. He was taken to the cath lab on 01/15/2012. He had a 3.5 x 12 mm Promus Element DES in the proximal Circumflex and a 3.0 x 16 mm Promus Element DES was deployed in the mid-LAD. He tolerated the procedure well. However, post cath he had onset of chest pain. His ECG showed ST elevation in the precordial leads and lateral depression. An emergency re-catheterization was performed and showed that the LAD was occluded proximal to the stent, likely from dissection of the vessel because of edge tear on front end of stent. I was able to open the vessel and placed a 3.0 x 20 mm Promus Element DES in the proximal LAD, proximal to the first stent in an overlapping fashion. He went into cardiogenic shock on the cath table and was intubated. He was hypotensive and started on multiple pressors. He was critically ill in the CCU for many days on pressors. He had ventilator dependent respiratory failure. Because of his history of AAA surgery, no balloon pump was used. His Plavix was discontinued and he was started on Brilinta. An echocardiogram showed preserved left ventricular function. He made a quick recovery after extubation and was discharged home without any supportive measures.   He is here today for follow up. He has no chest pain. He reports dyspnea since he has been home. He is wondering if this is due to the Brilinta. No pain in his legs. He feels weak overall.   Primary Care Physician: [facility] Jordan Hawks)   Past Medical History  Diagnosis Date  . Coronary artery disease     Cardiac cath March 2013 with DES placed Circumflex and DES x 2  in the LAD  .  Hypertension   . AAA (abdominal aortic aneurysm)     Aortic stent graft  . Carotid artery disease     s/p right CEA and right carotid stents  . PAD (peripheral artery disease)     ?Right SFA stent    Past Surgical History  Procedure Date  . Coronary angioplasty   . Carotid endarterectomy     Right  . Aortic stent graft     Current Outpatient Prescriptions  Medication Sig Dispense Refill  . allopurinol (ZYLOPRIM) 300 MG tablet Take 300 mg by mouth daily.      Marland Kitchen amitriptyline (ELAVIL) 50 MG tablet Take 1 tablet (50 mg total) by mouth at bedtime.  30 tablet  3  . aspirin 81 MG tablet Take 1 tablet (81 mg total) by mouth daily.      Marland Kitchen atenolol (TENORMIN) 50 MG tablet Take 0.5 tablets (25 mg total) by mouth daily.      . fish oil-omega-3 fatty acids 1000 MG capsule Take 2 g by mouth daily.      Marland Kitchen HYDROcodone-acetaminophen (LORCET) 10-650 MG per tablet Take 1 tablet by mouth every 6 (six) hours as needed. For pain.      Marland Kitchen levothyroxine (SYNTHROID, LEVOTHROID) 137 MCG tablet Take 137 mcg by mouth daily.      . Methylcellulose, Laxative, (CITRUCEL) 500 MG TABS Take 1,000 mg by mouth 2 (two) times daily.       Marland Kitchen  nitroGLYCERIN (NITROSTAT) 0.4 MG SL tablet Place 1 tablet (0.4 mg total) under the tongue every 5 (five) minutes as needed for chest pain.  25 tablet  3  . pantoprazole (PROTONIX) 40 MG tablet Take 40 mg by mouth 2 (two) times daily.      Marland Kitchen PARoxetine (PAXIL) 20 MG tablet Take 20 mg by mouth daily.      . simvastatin (ZOCOR) 40 MG tablet Take 40 mg by mouth every evening.      . Ticagrelor (BRILINTA) 90 MG TABS tablet Take 90 mg by mouth 2 (two) times daily. Taking 90mg  bid oral        Allergies  Allergen Reactions  . Codeine Itching    History   Social History  . Marital Status: Single    Spouse Name: N/A    Number of Children: 1  . Years of Education: N/A   Occupational History  .  [employer]   Social History Main Topics  . Smoking status: Former Smoker -- 1.0  packs/day for 50 years    Types: Cigarettes    Quit date: 01/14/2012  . Smokeless tobacco: Not on file   Comment: Has been  trying to quit  . Alcohol Use: No  . Drug Use: No  . Sexually Active: Yes   Other Topics Concern  . Not on file   Social History Narrative  . No narrative on file    Family History  Problem Relation Age of Onset  . Cancer Mother 47    breast/lung  . Lymphoma Father 39    Review of Systems:  As stated in the HPI and otherwise negative.   BP 142/80  Pulse 64  Ht 5\' 8"  (1.727 m)  Wt 174 lb 12.8 oz (79.289 kg)  BMI 26.58 kg/m2  Physical Examination: General: Well developed, well nourished, NAD HEENT: OP clear, mucus membranes moist SKIN: warm, dry. No rashes. Neuro: No focal deficits Musculoskeletal: Muscle strength 5/5 all ext Psychiatric: Mood and affect normal Neck: No JVD, no carotid bruits, no thyromegaly, no lymphadenopathy. Lungs:Clear bilaterally, no wheezes, rhonci, crackles Cardiovascular: Regular rate and rhythm with some ectopy. No murmurs, gallops or rubs. Abdomen:Soft. Bowel sounds present. Non-tender.  Extremities: No lower extremity edema. Pulses are 2 + in the bilateral PT. Non-palpable in the bilateral DP. His feet are reddish and toes are cool.

## 2012-04-01 NOTE — Patient Instructions (Signed)
Your physician recommends that you schedule a follow-up appointment in: 3 months.   Your physician has requested that you have an ankle brachial index (ABI). During this test an ultrasound and blood pressure cuff are used to evaluate the arteries that supply the arms and legs with blood. Allow thirty minutes for this exam. There are no restrictions or special instructions.   Your physician has recommended you make the following change in your medication: Stop atenolol.   Start Coreg 6.25 mg by mouth twice daily

## 2012-04-02 ENCOUNTER — Other Ambulatory Visit: Payer: Self-pay | Admitting: *Deleted

## 2012-04-02 DIAGNOSIS — I2510 Atherosclerotic heart disease of native coronary artery without angina pectoris: Secondary | ICD-10-CM

## 2012-04-08 ENCOUNTER — Telehealth: Payer: Self-pay | Admitting: Cardiovascular Disease

## 2012-04-08 DIAGNOSIS — I2510 Atherosclerotic heart disease of native coronary artery without angina pectoris: Secondary | ICD-10-CM

## 2012-04-08 MED ORDER — HYDRALAZINE HCL 25 MG PO TABS: 25.0000 mg | ORAL_TABLET | Freq: Two times a day (BID) | ORAL | Status: DC

## 2012-04-08 MED ORDER — PRASUGREL HCL 10 MG PO TABS: 10.0000 mg | ORAL_TABLET | Freq: Every day | ORAL | Status: AC

## 2012-04-08 NOTE — Telephone Encounter (Signed)
Spoke with pt who reports the following blood pressures:  June 16--148/87 at 10:45 AM  June 17--took several times during day--150/99,170/94,182/101,165/113, 182/114.  June 18- 157/89--10:45.  Heart rate upper 80's.  He has been sleeping poorly and restless at night. Has been waking up with a headache.  No other complaints. Is taking coreg. States shortness of breath went away after changing from Brilinta to Effient. Will forward to Dr. Clifton James for review

## 2012-04-08 NOTE — Telephone Encounter (Signed)
Pt calling BP being too high 157/89 about 1045 this am

## 2012-04-08 NOTE — Telephone Encounter (Signed)
Spoke with pt and gave him instructions from Dr. Clifton James. Will send prescriptions to [facility] in [place]. He will monitor blood pressure and call if it remains elevated.

## 2012-04-08 NOTE — Telephone Encounter (Signed)
Can we switch him from Brilinta to Effient 10 mg po Qdaily and also add Hydralazine 25 mg po BID for his elevated BP. Cannot use ARB or ACe-inh secondary to his renal insufficiency.

## 2012-04-22 ENCOUNTER — Encounter (INDEPENDENT_AMBULATORY_CARE_PROVIDER_SITE_OTHER): Payer: Medicare Other

## 2012-04-22 DIAGNOSIS — I739 Peripheral vascular disease, unspecified: Secondary | ICD-10-CM

## 2012-04-22 DIAGNOSIS — I70219 Atherosclerosis of native arteries of extremities with intermittent claudication, unspecified extremity: Secondary | ICD-10-CM

## 2012-04-23 ENCOUNTER — Telehealth: Payer: Self-pay | Admitting: Cardiovascular Disease

## 2012-04-23 NOTE — Telephone Encounter (Signed)
Walk In pt Form " [facility]" needs to be  Completed,sending to [facility] for Completion 04/23/12/KM

## 2012-05-01 ENCOUNTER — Other Ambulatory Visit (INDEPENDENT_AMBULATORY_CARE_PROVIDER_SITE_OTHER): Payer: Medicare Other

## 2012-05-01 DIAGNOSIS — I2510 Atherosclerotic heart disease of native coronary artery without angina pectoris: Secondary | ICD-10-CM

## 2012-05-01 LAB — BASIC METABOLIC PANEL
CO2: 28 mEq/L (ref 19–32)
Chloride: 102 mEq/L (ref 96–112)
Sodium: 137 mEq/L (ref 135–145)

## 2012-05-05 NOTE — Telephone Encounter (Signed)
Pt aware form has been completed. Will mail to his home at his request

## 2012-07-08 ENCOUNTER — Ambulatory Visit (INDEPENDENT_AMBULATORY_CARE_PROVIDER_SITE_OTHER): Payer: Medicare Other | Admitting: Cardiovascular Disease

## 2012-07-08 ENCOUNTER — Encounter: Payer: Self-pay | Admitting: Cardiovascular Disease

## 2012-07-08 VITALS — BP 150/75 | HR 76 | Ht 67.0 in | Wt 175.0 lb

## 2012-07-08 DIAGNOSIS — I10 Essential (primary) hypertension: Secondary | ICD-10-CM

## 2012-07-08 DIAGNOSIS — I2510 Atherosclerotic heart disease of native coronary artery without angina pectoris: Secondary | ICD-10-CM

## 2012-07-08 DIAGNOSIS — I739 Peripheral vascular disease, unspecified: Secondary | ICD-10-CM

## 2012-07-08 MED ORDER — CARVEDILOL 12.5 MG PO TABS: 12.5000 mg | ORAL_TABLET | Freq: Two times a day (BID) | ORAL | Status: DC

## 2012-07-08 NOTE — Patient Instructions (Addendum)
Your physician wants you to follow-up in:  6 months. You will receive a reminder letter in the mail two months in advance. If you don't receive a letter, please call our office to schedule the follow-up appointment.  Your physician has recommended you make the following change in your medication: Increase carvedilol to 12. 5 mg by mouth twice daily

## 2012-07-08 NOTE — Progress Notes (Signed)
History of Present Illness: [name] is a 66 year old white male with a history of coronary artery disease, tobacco abuse, PAD with cardiac cath March 2013 with severe disease in the LAD and Circumflex, now s/p DES x 1 Circumflex and DES x 2 LAD. He had chest pain and was admitted to [facility] on 01/14/12. His cardiac enzymes were negative. He was taken to the cath lab on 01/15/2012. He had a 3.5 x 12 mm Promus Element DES in the proximal Circumflex and a 3.0 x 16 mm Promus Element DES was deployed in the mid-LAD. He tolerated the procedure well. However, post cath he had onset of chest pain. His ECG showed ST elevation in the precordial leads and lateral depression. An emergency re-catheterization was performed and showed that the LAD was occluded proximal to the stent, likely from dissection of the vessel because of edge tear on front end of stent. I was able to open the vessel and placed a 3.0 x 20 mm Promus Element DES in the proximal LAD, proximal to the first stent in an overlapping fashion. He went into cardiogenic shock on the cath table and was intubated. He was hypotensive and started on multiple pressors. He was critically ill in the CCU for many days on pressors. He had ventilator dependent respiratory failure. Because of his history of AAA surgery, no balloon pump was used. His Plavix was discontinued and he was started on Brilinta. An echocardiogram showed preserved left ventricular function. He made a quick recovery after extubation and was discharged home without any supportive measures. His Brilinta was changed to Effient secondary to dyspnea and this improved.   He is here today for follow up. He has no chest pain. No pain in his legs. He has been active. BP up in afternoons.   Primary Care Physician: Jordan Hawks  Last Lipid Profile: January in ACCELERATE study  Past Medical History  Diagnosis Date  . Coronary artery disease     Cardiac cath March 2013 with DES placed  Circumflex and DES x 2  in the LAD  . Hypertension   . AAA (abdominal aortic aneurysm)     Aortic stent graft  . Carotid artery disease     s/p right CEA and right carotid stents  . PAD (peripheral artery disease)     ?Right SFA stent    Past Surgical History  Procedure Date  . Coronary angioplasty   . Carotid endarterectomy     Right  . Aortic stent graft     Current Outpatient Prescriptions  Medication Sig Dispense Refill  . allopurinol (ZYLOPRIM) 300 MG tablet Take 300 mg by mouth daily.      Marland Kitchen amitriptyline (ELAVIL) 50 MG tablet Take 1 tablet (50 mg total) by mouth at bedtime.  30 tablet  3  . aspirin 81 MG tablet Take 1 tablet (81 mg total) by mouth daily.      . carvedilol (COREG) 6.25 MG tablet Take 1 tablet (6.25 mg total) by mouth 2 (two) times daily.  60 tablet  6  . fish oil-omega-3 fatty acids 1000 MG capsule Take 2 g by mouth daily.      . hydrALAZINE (APRESOLINE) 25 MG tablet Take 1 tablet (25 mg total) by mouth 2 (two) times daily.  60 tablet  11  . HYDROcodone-acetaminophen (LORCET) 10-650 MG per tablet Take 1 tablet by mouth every 6 (six) hours as needed. For pain.      Marland Kitchen levothyroxine (SYNTHROID, LEVOTHROID) 137 MCG tablet  Take 137 mcg by mouth daily.      . Methylcellulose, Laxative, (CITRUCEL) 500 MG TABS Take 1,000 mg by mouth 2 (two) times daily.       . nitroGLYCERIN (NITROSTAT) 0.4 MG SL tablet Place 1 tablet (0.4 mg total) under the tongue every 5 (five) minutes as needed for chest pain.  25 tablet  3  . pantoprazole (PROTONIX) 40 MG tablet Take 40 mg by mouth 2 (two) times daily.      Marland Kitchen PARoxetine (PAXIL) 20 MG tablet Take 20 mg by mouth daily.      . prasugrel (EFFIENT) 10 MG TABS Take 1 tablet (10 mg total) by mouth daily.  30 tablet  11  . simvastatin (ZOCOR) 40 MG tablet Take 20 mg by mouth every evening.       . STUDY MEDICATION ACCELERATE Study        Allergies  Allergen Reactions  . Codeine Itching    History   Social History  . Marital  Status: Single    Spouse Name: N/A    Number of Children: 1  . Years of Education: N/A   Occupational History  .  [employer]   Social History Main Topics  . Smoking status: Former Smoker -- 1.0 packs/day for 50 years    Types: Cigarettes    Quit date: 01/14/2012  . Smokeless tobacco: Not on file   Comment: Has been  trying to quit  . Alcohol Use: No  . Drug Use: No  . Sexually Active: Yes   Other Topics Concern  . Not on file   Social History Narrative  . No narrative on file    Family History  Problem Relation Age of Onset  . Cancer Mother 80    breast/lung  . Lymphoma Father 31    Review of Systems:  As stated in the HPI and otherwise negative.   BP 150/75  Pulse 76  Ht 5\' 7"  (1.702 m)  Wt 175 lb (79.379 kg)  BMI 27.41 kg/m2  Physical Examination: General: Well developed, well nourished, NAD HEENT: OP clear, mucus membranes moist SKIN: warm, dry. No rashes. Neuro: No focal deficits Musculoskeletal: Muscle strength 5/5 all ext Psychiatric: Mood and affect normal Neck: No JVD, no carotid bruits, no thyromegaly, no lymphadenopathy. Lungs:Clear bilaterally, no wheezes, rhonci, crackles Cardiovascular: Regular rate and rhythm. No murmurs, gallops or rubs. Abdomen:Soft. Bowel sounds present. Non-tender.  Extremities: No lower extremity edema. Pulses are 2 + in the bilateral DP/PT.   Assessment and Plan:   1. CAD: Stable. He is on ASA and Effient and will need at least one year of dual anti-platelet therapy.  Will increase Coreg to 12.5 mg po BID for better BP control. Continue statin. Check and see if lipids have been done in research (ACCELERATE). Ace-inh stopped secondary to renal insufficiency.   2. PAD: He is known to have severe PAD and has been followed in [facility]. He wishes to have this followed here. ABI July 2013 with ABI of 0.91 on the right and 0.84 on the left. Will check and see if carotids have been done recently in [facility]. If not, needs  updated.   3. HTN: Increase Coreg to 12.5 mg po BID

## 2012-12-22 ENCOUNTER — Encounter: Payer: Self-pay | Admitting: Cardiovascular Disease

## 2012-12-29 ENCOUNTER — Ambulatory Visit (INDEPENDENT_AMBULATORY_CARE_PROVIDER_SITE_OTHER): Payer: Medicare Other | Admitting: Cardiovascular Disease

## 2012-12-29 ENCOUNTER — Encounter: Payer: Self-pay | Admitting: Cardiovascular Disease

## 2012-12-29 VITALS — BP 155/79 | HR 75 | Ht 68.0 in | Wt 182.0 lb

## 2012-12-29 DIAGNOSIS — I779 Disorder of arteries and arterioles, unspecified: Secondary | ICD-10-CM

## 2012-12-29 DIAGNOSIS — I739 Peripheral vascular disease, unspecified: Secondary | ICD-10-CM

## 2012-12-29 DIAGNOSIS — I10 Essential (primary) hypertension: Secondary | ICD-10-CM

## 2012-12-29 DIAGNOSIS — I2510 Atherosclerotic heart disease of native coronary artery without angina pectoris: Secondary | ICD-10-CM

## 2012-12-29 NOTE — Patient Instructions (Signed)
Your physician wants you to follow-up in:  6 months. You will receive a reminder letter in the mail two months in advance. If you don't receive a letter, please call our office to schedule the follow-up appointment.  Your physician has requested that you have a carotid duplex. This test is an ultrasound of the carotid arteries in your neck. It looks at blood flow through these arteries that supply the brain with blood. Allow one hour for this exam. There are no restrictions or special instructions.  Your physician has requested that you have an ankle brachial index (ABI). During this test an ultrasound and blood pressure cuff are used to evaluate the arteries that supply the arms and legs with blood. Allow thirty minutes for this exam. There are no restrictions or special instructions. To be done in July

## 2012-12-29 NOTE — Progress Notes (Signed)
History of Present Illness: [name] is a 67 year old white male with a history of coronary artery disease, tobacco abuse, PAD with cardiac cath March 2013 with severe disease in the LAD and Circumflex, now s/p DES x 1 Circumflex and DES x 2 LAD. He had chest pain and was admitted to [facility] on 01/14/12. His cardiac enzymes were negative. He was taken to the cath lab on 01/15/2012. He had a 3.5 x 12 mm Promus Element DES in the proximal Circumflex and a 3.0 x 16 mm Promus Element DES was deployed in the mid-LAD. He tolerated the procedure well. However, post cath he had onset of chest pain. His ECG showed ST elevation in the precordial leads and lateral depression. An emergency re-catheterization was performed and showed that the LAD was occluded proximal to the stent, likely from dissection of the vessel because of edge tear on front end of stent. I was able to open the vessel and placed a 3.0 x 20 mm Promus Element DES in the proximal LAD, proximal to the first stent in an overlapping fashion. He went into cardiogenic shock on the cath table and was intubated. He was hypotensive and started on multiple pressors. He was critically ill in the CCU for many days on pressors. He had ventilator dependent respiratory failure. Because of his history of AAA surgery, no balloon pump was used. His Plavix was discontinued and he was started on Brilinta. An echocardiogram showed preserved left ventricular function. He made a quick recovery after extubation and was discharged home without any supportive measures. His Brilinta was changed to Effient secondary to dyspnea and this improved.   He is here today for follow up. He has no chest pain. No pain in his legs. He has been active. His back pain is his major issue. He has considered back surgery. No near syncope or syncope.   Primary Care Physician: Nathan Mcgee   Last Lipid Profile: January in ACCELERATE study   Past Medical History  Diagnosis Date  .  Coronary artery disease     Cardiac cath March 2013 with DES placed Circumflex and DES x 2  in the LAD  . Hypertension   . AAA (abdominal aortic aneurysm)     Aortic stent graft  . Carotid artery disease     s/p right CEA and right carotid (stent 11/09). Carotid dopplers 06/27/11 with RICA patent with 143cm/sec velocity, LICA known occlusion  . PAD (peripheral artery disease)     s/p abdominal aortic endograft with left to right fem-pop bypass. S/p  stenting left external iliac artery 12/17/08. CTA 11/12 with occlusion right limb of graft with patent left to righ fem-fem graft. Mild SFA disease bilaterally.     Past Surgical History  Procedure Laterality Date  . Coronary angioplasty    . Carotid endarterectomy      Right  . Aortic stent graft      Current Outpatient Prescriptions  Medication Sig Dispense Refill  . allopurinol (ZYLOPRIM) 300 MG tablet Take 300 mg by mouth daily.      Marland Kitchen amitriptyline (ELAVIL) 25 MG tablet Take 25 mg by mouth at bedtime.      Marland Kitchen aspirin 81 MG tablet Take 1 tablet (81 mg total) by mouth daily.      . carvedilol (COREG) 12.5 MG tablet Take 1 tablet (12.5 mg total) by mouth 2 (two) times daily.  60 tablet  6  . fish oil-omega-3 fatty acids 1000 MG capsule Take 2 g by  mouth daily.      . hydrALAZINE (APRESOLINE) 25 MG tablet Take 1 tablet (25 mg total) by mouth 2 (two) times daily.  60 tablet  11  . levothyroxine (SYNTHROID, LEVOTHROID) 137 MCG tablet Take 137 mcg by mouth daily.      . Methylcellulose, Laxative, (CITRUCEL) 500 MG TABS Take 1,000 mg by mouth once.       . nitroGLYCERIN (NITROSTAT) 0.4 MG SL tablet Place 1 tablet (0.4 mg total) under the tongue every 5 (five) minutes as needed for chest pain.  25 tablet  3  . pantoprazole (PROTONIX) 40 MG tablet Take 40 mg by mouth 2 (two) times daily.      Marland Kitchen PARoxetine (PAXIL) 20 MG tablet Take 20 mg by mouth daily.      . prasugrel (EFFIENT) 10 MG TABS Take 1 tablet (10 mg total) by mouth daily.  30 tablet  11   . simvastatin (ZOCOR) 40 MG tablet Take 20 mg by mouth every evening.       . STUDY MEDICATION ACCELERATE Study       No current facility-administered medications for this visit.    Allergies  Allergen Reactions  . Codeine Itching    History   Social History  . Marital Status: Single    Spouse Name: N/A    Number of Children: 1  . Years of Education: N/A   Occupational History  .  Nathan Mcgee   Social History Main Topics  . Smoking status: Former Smoker -- 1.00 packs/day for 50 years    Types: Cigarettes    Quit date: 01/14/2012  . Smokeless tobacco: Not on file     Comment: Has been  trying to quit  . Alcohol Use: No  . Drug Use: No  . Sexually Active: Yes   Other Topics Concern  . Not on file   Social History Narrative  . No narrative on file    Family History  Problem Relation Age of Onset  . Cancer Mother 1    breast/lung  . Lymphoma Father 8    Review of Systems:  As stated in the HPI and otherwise negative.   BP 155/79  Pulse 75  Ht 5\' 8"  (1.727 m)  Wt 182 lb (82.555 kg)  BMI 27.68 kg/m2  Physical Examination: General: Well developed, well nourished, NAD HEENT: OP clear, mucus membranes moist SKIN: warm, dry. No rashes. Neuro: No focal deficits Musculoskeletal: Muscle strength 5/5 all ext Psychiatric: Mood and affect normal Neck: No JVD, no carotid bruits, no thyromegaly, no lymphadenopathy. Lungs:Clear bilaterally, no wheezes, rhonci, crackles Cardiovascular: Regular rate and rhythm. No murmurs, gallops or rubs. Abdomen:Soft. Bowel sounds present. Non-tender.  Extremities: No lower extremity edema. Pulses are 1 + in the bilateral DP/PT.  Assessment and Plan:   1. CAD: Stable. He is on ASA and Effient and will need at least one year of dual anti-platelet therapy.  Will continue Coreg 12.5 mg po BID. Continue statin. Lipids followed in research (ACCELERATE). Ace-inh stopped secondary to renal insufficiency.   2. PAD: Stable. No leg pain.  He is known to have severe PAD and has been followed in [facility]. He wishes to have this followed here. ABI July 2013 with ABI of 0.91 on the right and 0.84 on the left. See above for all of his vascular history in the templated PMH section. Will repeat ABI in July 2014.   3. HTN: Well controlled at home. Continue Coreg 12.5 mg po BID and Hydralazine.  4. Carotid artery disease: He is known to have occluded LICA and has had right CEA followed by RICA stent placement. Last dopplers 2012 in [facility]. Will arrange carotid dopplers today.

## 2013-01-12 ENCOUNTER — Encounter (INDEPENDENT_AMBULATORY_CARE_PROVIDER_SITE_OTHER): Payer: Medicare Other

## 2013-01-12 DIAGNOSIS — I6529 Occlusion and stenosis of unspecified carotid artery: Secondary | ICD-10-CM

## 2013-01-12 DIAGNOSIS — I739 Peripheral vascular disease, unspecified: Secondary | ICD-10-CM

## 2013-02-12 ENCOUNTER — Encounter (HOSPITAL_COMMUNITY): Payer: Self-pay | Admitting: Emergency Medicine

## 2013-02-12 ENCOUNTER — Emergency Department (HOSPITAL_COMMUNITY): Payer: Medicare Other

## 2013-02-12 DIAGNOSIS — Z9889 Other specified postprocedural states: Secondary | ICD-10-CM | POA: Insufficient documentation

## 2013-02-12 DIAGNOSIS — Y9389 Activity, other specified: Secondary | ICD-10-CM | POA: Insufficient documentation

## 2013-02-12 DIAGNOSIS — W208XXA Other cause of strike by thrown, projected or falling object, initial encounter: Secondary | ICD-10-CM | POA: Insufficient documentation

## 2013-02-12 DIAGNOSIS — M25449 Effusion, unspecified hand: Secondary | ICD-10-CM | POA: Insufficient documentation

## 2013-02-12 DIAGNOSIS — Z9861 Coronary angioplasty status: Secondary | ICD-10-CM | POA: Insufficient documentation

## 2013-02-12 DIAGNOSIS — Y990 Civilian activity done for income or pay: Secondary | ICD-10-CM | POA: Insufficient documentation

## 2013-02-12 DIAGNOSIS — I2510 Atherosclerotic heart disease of native coronary artery without angina pectoris: Secondary | ICD-10-CM | POA: Insufficient documentation

## 2013-02-12 DIAGNOSIS — Z7982 Long term (current) use of aspirin: Secondary | ICD-10-CM | POA: Insufficient documentation

## 2013-02-12 DIAGNOSIS — Z87891 Personal history of nicotine dependence: Secondary | ICD-10-CM | POA: Insufficient documentation

## 2013-02-12 DIAGNOSIS — I739 Peripheral vascular disease, unspecified: Secondary | ICD-10-CM | POA: Insufficient documentation

## 2013-02-12 DIAGNOSIS — Z8679 Personal history of other diseases of the circulatory system: Secondary | ICD-10-CM | POA: Insufficient documentation

## 2013-02-12 DIAGNOSIS — I10 Essential (primary) hypertension: Secondary | ICD-10-CM | POA: Insufficient documentation

## 2013-02-12 DIAGNOSIS — S60229A Contusion of unspecified hand, initial encounter: Secondary | ICD-10-CM | POA: Insufficient documentation

## 2013-02-12 DIAGNOSIS — Y9289 Other specified places as the place of occurrence of the external cause: Secondary | ICD-10-CM | POA: Insufficient documentation

## 2013-02-12 DIAGNOSIS — Z79899 Other long term (current) drug therapy: Secondary | ICD-10-CM | POA: Insufficient documentation

## 2013-02-12 NOTE — ED Notes (Signed)
PT. PRESENTS WITH SWELLING / EDEMA AT RIGHT HAND , A PIECE OF LOG FELL ON IT THIS EVENING WHILE WORKING IN THE YARD .

## 2013-02-13 ENCOUNTER — Emergency Department (HOSPITAL_COMMUNITY)
Admission: EM | Admit: 2013-02-13 | Discharge: 2013-02-13 | Disposition: A | Discharge status: Home / Self Care | Payer: Medicare Other | Attending: Emergency Medicine | Admitting: Emergency Medicine

## 2013-02-13 DIAGNOSIS — S60221A Contusion of right hand, initial encounter: Secondary | ICD-10-CM

## 2013-02-13 DIAGNOSIS — M25441 Effusion, right hand: Secondary | ICD-10-CM

## 2013-02-13 MED ORDER — HYDROCODONE-ACETAMINOPHEN 5-325 MG PO TABS: 1.0000 | ORAL_TABLET | Freq: Four times a day (QID) | ORAL | Status: DC | PRN

## 2013-02-13 MED ORDER — FENTANYL CITRATE 0.05 MG/ML IJ SOLN
50.0000 ug | Freq: Once | INTRAMUSCULAR | Status: AC
  Administered 2013-02-13: 50 ug via INTRAVENOUS
  Filled 2013-02-13: qty 2

## 2013-02-13 MED ORDER — OXYCODONE-ACETAMINOPHEN 5-325 MG PO TABS
1.0000 | ORAL_TABLET | Freq: Once | ORAL | Status: AC
  Administered 2013-02-13: 1 via ORAL
  Filled 2013-02-13: qty 1

## 2013-02-13 NOTE — ED Notes (Signed)
Pt back from x-ray.

## 2013-02-13 NOTE — ED Notes (Signed)
Patient transported to X-ray 

## 2013-02-13 NOTE — ED Provider Notes (Signed)
History     CSN: [id number]  Arrival date & time 02/12/13  2333   First MD Initiated Contact with Patient 02/13/13 0157      No chief complaint on file.   (Consider location/radiation/quality/duration/timing/severity/associated sxs/prior treatment) HPI Nathan Mcgee is a 67 y.o. male presentingn with right hand pain after a log fell on it while working in his yard.  Pain is moderate, well localized, throbbing, no associated numbness, tingling or loss of movement.  He has swelling to the hand which limits full hand flexion.  Ice has helped a little. Fentanyl has helped. No other associated symptoms.  Past Medical History  Diagnosis Date  . Coronary artery disease     Cardiac cath March 2013 with DES placed Circumflex and DES x 2  in the LAD  . Hypertension   . AAA (abdominal aortic aneurysm)     Aortic stent graft  . Carotid artery disease     s/p right CEA and right carotid (stent 11/09). Carotid dopplers 06/27/11 with RICA patent with 143cm/sec velocity, LICA known occlusion  . PAD (peripheral artery disease)     s/p abdominal aortic endograft with left to right fem-pop bypass. S/p  stenting left external iliac artery 12/17/08. CTA 11/12 with occlusion right limb of graft with patent left to righ fem-fem graft. Mild SFA disease bilaterally.     Past Surgical History  Procedure Laterality Date  . Coronary angioplasty    . Carotid endarterectomy      Right  . Aortic stent graft      Family History  Problem Relation Age of Onset  . Cancer Mother 75    breast/lung  . Lymphoma Father 31    History  Substance Use Topics  . Smoking status: Former Smoker -- 1.00 packs/day for 50 years    Types: Cigarettes    Quit date: 01/14/2012  . Smokeless tobacco: Not on file     Comment: Has been  trying to quit  . Alcohol Use: No      Review of Systems At least 10pt or greater review of systems completed and are negative except where specified in the HPI.  Allergies   Codeine  Home Medications   Current Outpatient Rx  Name  Route  Sig  Dispense  Refill  . allopurinol (ZYLOPRIM) 300 MG tablet   Oral   Take 300 mg by mouth daily.         Marland Kitchen amitriptyline (ELAVIL) 25 MG tablet   Oral   Take 25 mg by mouth at bedtime.         Marland Kitchen aspirin 81 MG tablet   Oral   Take 1 tablet (81 mg total) by mouth daily.         . carvedilol (COREG) 12.5 MG tablet   Oral   Take 1 tablet (12.5 mg total) by mouth 2 (two) times daily.   60 tablet   6   . fish oil-omega-3 fatty acids 1000 MG capsule   Oral   Take 2 g by mouth daily.         . hydrALAZINE (APRESOLINE) 25 MG tablet   Oral   Take 1 tablet (25 mg total) by mouth 2 (two) times daily.   60 tablet   11   . levothyroxine (SYNTHROID, LEVOTHROID) 137 MCG tablet   Oral   Take 137 mcg by mouth daily.         . Methylcellulose, Laxative, (CITRUCEL) 500 MG TABS   Oral  Take 1,000 mg by mouth daily.          . nitroGLYCERIN (NITROSTAT) 0.4 MG SL tablet   Sublingual   Place 1 tablet (0.4 mg total) under the tongue every 5 (five) minutes as needed for chest pain.   25 tablet   3   . pantoprazole (PROTONIX) 40 MG tablet   Oral   Take 40 mg by mouth 2 (two) times daily.         Marland Kitchen PARoxetine (PAXIL) 20 MG tablet   Oral   Take 20 mg by mouth daily.         . prasugrel (EFFIENT) 10 MG TABS   Oral   Take 1 tablet (10 mg total) by mouth daily.   30 tablet   11   . simvastatin (ZOCOR) 40 MG tablet   Oral   Take 20 mg by mouth every evening.            BP 143/122  Pulse 68  Temp(Src) 98 F (36.7 C) (Oral)  Resp 18  SpO2 96%  Physical Exam  Musculoskeletal:       Hands:   Nursing notes reviewed.  Electronic medical record reviewed. VITAL SIGNS:   Filed Vitals:   02/12/13 2342 02/13/13 0401  BP: 143/122 145/85  Pulse: 68 70  Temp: 98 F (36.7 C)   TempSrc: Oral   Resp: 18 18  SpO2: 96% 99%   CONSTITUTIONAL: Awake, oriented, appears non-toxic HENT:  Atraumatic, normocephalic, oral mucosa pink and moist, airway patent. Nares patent without drainage. External ears normal. EYES: Conjunctiva clear, EOMI, PERRLA NECK: Trachea midline, non-tender, supple CARDIOVASCULAR: Normal heart rate, Normal rhythm, No murmurs, rubs, gallops PULMONARY/CHEST: Clear to auscultation, no rhonchi, wheezes, or rales. Symmetrical breath sounds. Non-tender. ABDOMINAL: Non-distended, soft, non-tender - no rebound or guarding.  BS normal. NEUROLOGIC: Non-focal, moving all four extremities, no gross sensory or motor deficits. EXTREMITIES: No clubbing, cyanosis.  Right hand contusion over 2nd and 3rd MC, swelling over dorsum of the hand, tender to palpation but compartments are soft.  Obvious blue/purple contusion.  Full extension, 90% flexion with 5/5/ strength, - limited flexion by pain 2/2 to dorsal swelling.  2 point discrimination in median and ulnar nerve distributions intact to 5mm with fairly think callus on his hands.  Radial nerve tested with static 2 point discrimination to 1.5cm. SKIN: Warm, Dry, No erythema, No rash  ED Course  Procedures (including critical care time)  Labs Reviewed - No data to display Dg Hand Complete Right  02/13/2013  *RADIOLOGY REPORT*  Clinical Data: Trauma.  Pain and swelling throughout hand.  RIGHT HAND - COMPLETE 3+ VIEW  Comparison: None  Findings: Dorsal soft tissue swelling, centered about the level of the metacarpals. No acute fracture or dislocation.  There may be soft tissue swelling within the phalanges as well.  IMPRESSION: No acute osseous abnormality.  Dorsal soft tissue swelling.   Original Report Authenticated By: Jeronimo Greaves, M.D.      1. Hand contusion, right, initial encounter   2. Swelling of hand joint, right       MDM  Pt w/ heart history on prasugrel presents with hand contusion - likely [place] hematoma, compartments are soft, 2pt discrimination is intact.  Encourage RICE therapy.  Follow up with hand in 2 days  for reassessment.  No surgical emergency at this time.  I explained the diagnosis and have given explicit precautions to return to the ER including loss of sensation, coldness, weakness in the  hand or any other new or worsening symptoms. The patient understands and accepts the medical plan as it's been dictated and I have answered their questions. Discharge instructions concerning home care and prescriptions have been given.  The patient is STABLE and is discharged to home in good condition.            Jones Skene, MD 02/16/13 1140

## 2013-02-13 NOTE — ED Notes (Signed)
MD at bedside. Bonk

## 2013-05-05 ENCOUNTER — Encounter (INDEPENDENT_AMBULATORY_CARE_PROVIDER_SITE_OTHER): Payer: Medicare Other

## 2013-05-05 ENCOUNTER — Encounter: Payer: Self-pay | Admitting: Cardiovascular Disease

## 2013-05-05 DIAGNOSIS — I739 Peripheral vascular disease, unspecified: Secondary | ICD-10-CM

## 2013-05-26 ENCOUNTER — Telehealth: Payer: Self-pay | Admitting: Cardiovascular Disease

## 2013-05-26 NOTE — Telephone Encounter (Signed)
OK for patient to hold Effient for procedure. Do we have a form from this office to send back? chris

## 2013-05-26 NOTE — Telephone Encounter (Signed)
New problem   [facility] need clearance for pt to hold Effient prior to an upper endo for 7 days. Please call.

## 2013-05-27 ENCOUNTER — Other Ambulatory Visit: Payer: Self-pay

## 2013-05-27 NOTE — Telephone Encounter (Signed)
Spoke with receptionist at [facility]. There is no form to fill out and this phone note should be sufficient.  Will fax to [place] at [phone].

## 2013-06-03 ENCOUNTER — Encounter (INDEPENDENT_AMBULATORY_CARE_PROVIDER_SITE_OTHER): Payer: Self-pay

## 2013-06-03 DIAGNOSIS — R0989 Other specified symptoms and signs involving the circulatory and respiratory systems: Secondary | ICD-10-CM

## 2013-07-16 ENCOUNTER — Encounter: Payer: Self-pay | Admitting: Cardiovascular Disease

## 2013-07-16 ENCOUNTER — Ambulatory Visit (INDEPENDENT_AMBULATORY_CARE_PROVIDER_SITE_OTHER): Payer: Medicare Other | Admitting: Cardiovascular Disease

## 2013-07-16 ENCOUNTER — Encounter (INDEPENDENT_AMBULATORY_CARE_PROVIDER_SITE_OTHER): Payer: Medicare Other

## 2013-07-16 VITALS — BP 133/77 | HR 64 | Ht 68.0 in | Wt 181.8 lb

## 2013-07-16 DIAGNOSIS — I2510 Atherosclerotic heart disease of native coronary artery without angina pectoris: Secondary | ICD-10-CM

## 2013-07-16 DIAGNOSIS — R5383 Other fatigue: Secondary | ICD-10-CM

## 2013-07-16 DIAGNOSIS — R5381 Other malaise: Secondary | ICD-10-CM

## 2013-07-16 DIAGNOSIS — I739 Peripheral vascular disease, unspecified: Secondary | ICD-10-CM

## 2013-07-16 DIAGNOSIS — I779 Disorder of arteries and arterioles, unspecified: Secondary | ICD-10-CM

## 2013-07-16 DIAGNOSIS — I6529 Occlusion and stenosis of unspecified carotid artery: Secondary | ICD-10-CM

## 2013-07-16 DIAGNOSIS — E039 Hypothyroidism, unspecified: Secondary | ICD-10-CM

## 2013-07-16 DIAGNOSIS — I10 Essential (primary) hypertension: Secondary | ICD-10-CM

## 2013-07-16 LAB — TSH: TSH: 2.57 u[IU]/mL (ref 0.35–5.50)

## 2013-07-16 NOTE — Progress Notes (Signed)
History of Present Illness: [name] is a 67 year old white male with a history of coronary artery disease, tobacco abuse, PAD with cardiac cath March 2013 with severe disease in the LAD and Circumflex, now s/p DES x 1 Circumflex and DES x 2 LAD. He had chest pain and was admitted to [facility] on 01/14/12. His cardiac enzymes were negative. He was taken to the cath lab on 01/15/2012. He had a 3.5 x 12 mm Promus Element DES in the proximal Circumflex and a 3.0 x 16 mm Promus Element DES was deployed in the mid-LAD. He tolerated the procedure well. However, post cath he had onset of chest pain. His ECG showed ST elevation in the precordial leads and lateral depression. An emergency re-catheterization was performed and showed that the LAD was occluded proximal to the stent, likely from dissection of the vessel because of edge tear on front end of stent. I was able to open the vessel and placed a 3.0 x 20 mm Promus Element DES in the proximal LAD, proximal to the first stent in an overlapping fashion. He went into cardiogenic shock on the cath table and was intubated. He was hypotensive and started on multiple pressors. He was critically ill in the CCU for many days on pressors. He had ventilator dependent respiratory failure. Because of his history of AAA surgery, no balloon pump was used. His Plavix was discontinued and he was started on Brilinta. An echocardiogram showed preserved left ventricular function. He made a quick recovery after extubation and was discharged home without any supportive measures. His Brilinta was changed to Effient secondary to dyspnea and this improved. Carotid artery dopplers 07/16/13 with stable 60-79% RICA stenosis, occluded LICA.   He is here today for follow up. He has no chest pain. No pain in his legs. He has been active. His back pain is his major issue. He has considered back surgery. No near syncope or syncope. He does have fatigue in am.   Primary Care Physician: Jordan Hawks   Last Lipid Profile: January in ACCELERATE study   Past Medical History  Diagnosis Date  . Coronary artery disease     Cardiac cath March 2013 with DES placed Circumflex and DES x 2  in the LAD  . Hypertension   . AAA (abdominal aortic aneurysm)     Aortic stent graft  . Carotid artery disease     s/p right CEA and right carotid (stent 11/09). Carotid dopplers 06/27/11 with RICA patent with 143cm/sec velocity, LICA known occlusion  . PAD (peripheral artery disease)     s/p abdominal aortic endograft with left to right fem-pop bypass. S/p  stenting left external iliac artery 12/17/08. CTA 11/12 with occlusion right limb of graft with patent left to righ fem-fem graft. Mild SFA disease bilaterally.     Past Surgical History  Procedure Laterality Date  . Coronary angioplasty    . Carotid endarterectomy      Right  . Aortic stent graft      Current Outpatient Prescriptions  Medication Sig Dispense Refill  . amitriptyline (ELAVIL) 25 MG tablet Take 25 mg by mouth at bedtime.      Marland Kitchen aspirin 81 MG tablet Take 1 tablet (81 mg total) by mouth daily.      . carvedilol (COREG) 12.5 MG tablet Take 12.5 mg by mouth 2 (two) times daily.      . fish oil-omega-3 fatty acids 1000 MG capsule Take 2 g by mouth daily.      Marland Kitchen  hydrALAZINE (APRESOLINE) 25 MG tablet Take 25 mg by mouth as directed.      Marland Kitchen HYDROcodone-acetaminophen (NORCO/VICODIN) 5-325 MG per tablet Take 1-2 tablets by mouth every 6 (six) hours as needed for pain.  23 tablet  0  . levothyroxine (SYNTHROID, LEVOTHROID) 137 MCG tablet Take 137 mcg by mouth daily.      . Methylcellulose, Laxative, (CITRUCEL) 500 MG TABS Take 500 mg by mouth daily.       . Multiple Vitamin (MULTIVITAMIN) tablet Take 1 tablet by mouth daily.      . nitroGLYCERIN (NITROSTAT) 0.4 MG SL tablet Place 1 tablet (0.4 mg total) under the tongue every 5 (five) minutes as needed for chest pain.  25 tablet  3  . NON FORMULARY Study Drug Evacetrapib      .  pantoprazole (PROTONIX) 40 MG tablet Take 40 mg by mouth 2 (two) times daily.      Marland Kitchen PARoxetine (PAXIL) 20 MG tablet Take 10 mg by mouth daily.       . prasugrel (EFFIENT) 10 MG TABS Take 1 tablet (10 mg total) by mouth daily.  30 tablet  11  . simvastatin (ZOCOR) 40 MG tablet Take 20 mg by mouth every evening.        No current facility-administered medications for this visit.    Allergies  Allergen Reactions  . Codeine Itching    History   Social History  . Marital Status: Single    Spouse Name: N/A    Number of Children: 1  . Years of Education: N/A   Occupational History  .  [employer]   Social History Main Topics  . Smoking status: Former Smoker -- 1.00 packs/day for 50 years    Types: Cigarettes    Quit date: 01/14/2012  . Smokeless tobacco: Not on file     Comment: Has been  trying to quit  . Alcohol Use: No  . Drug Use: No  . Sexual Activity: Yes   Other Topics Concern  . Not on file   Social History Narrative  . No narrative on file    Family History  Problem Relation Age of Onset  . Cancer Mother 105    breast/lung  . Lymphoma Father 43    Review of Systems:  As stated in the HPI and otherwise negative.   BP 133/77  Pulse 64  Ht 5\' 8"  (1.727 m)  Wt 181 lb 12.8 oz (82.464 kg)  BMI 27.65 kg/m2  Physical Examination: General: Well developed, well nourished, NAD HEENT: OP clear, mucus membranes moist SKIN: warm, dry. No rashes. Neuro: No focal deficits Musculoskeletal: Muscle strength 5/5 all ext Psychiatric: Mood and affect normal Neck: No JVD, rigth carotid bruit, no thyromegaly, no lymphadenopathy. Lungs:Clear bilaterally, no wheezes, rhonci, crackles Cardiovascular: Regular rate and rhythm. No murmurs, gallops or rubs. Abdomen:Soft. Bowel sounds present. Non-tender.  Extremities: No lower extremity edema. Pulses are 1 + in the bilateral DP/PT.  EKG: Sinus brady, rate 58 bpm. LVH. Poor R wave progression  Assessment and Plan:   1. CAD:  Stable. He is on ASA and Effient and will continue given stent thrombosis, severe PAD and carotid artery disease.  Will continue Coreg 12.5 mg po BID. He will try taking later in am to see if it helps early am fatigue.  Continue statin. Lipids followed in research (ACCELERATE). Ace-inh stopped secondary to renal insufficiency. Will discuss screening stress test at next visit.   2. PAD: Stable by dopplers July 2014. No  leg pain.  Repeat ABI 1 year.   3. HTN: Well controlled at home. Continue Coreg 12.5 mg po BID and Hydralazine.   4. Carotid artery disease: He is known to have occluded LICA and has had right CEA followed by RICA stent placement. Last dopplers today with stable disease.  Repeat carotid dopplers in 6 months.

## 2013-07-16 NOTE — Patient Instructions (Signed)
Your physician wants you to follow-up in:  6 months. You will receive a reminder letter in the mail two months in advance. If you don't receive a letter, please call our office to schedule the follow-up appointment.   

## 2013-08-27 ENCOUNTER — Other Ambulatory Visit: Payer: Self-pay

## 2013-08-29 ENCOUNTER — Emergency Department (HOSPITAL_COMMUNITY)
Admission: EM | Admit: 2013-08-29 | Discharge: 2013-08-29 | Disposition: A | Discharge status: Home / Self Care | Payer: Medicare Other | Attending: Emergency Medicine | Admitting: Emergency Medicine

## 2013-08-29 DIAGNOSIS — Y929 Unspecified place or not applicable: Secondary | ICD-10-CM | POA: Insufficient documentation

## 2013-08-29 DIAGNOSIS — Z79899 Other long term (current) drug therapy: Secondary | ICD-10-CM | POA: Insufficient documentation

## 2013-08-29 DIAGNOSIS — Y939 Activity, unspecified: Secondary | ICD-10-CM | POA: Insufficient documentation

## 2013-08-29 DIAGNOSIS — S99922A Unspecified injury of left foot, initial encounter: Secondary | ICD-10-CM

## 2013-08-29 DIAGNOSIS — Z7982 Long term (current) use of aspirin: Secondary | ICD-10-CM | POA: Insufficient documentation

## 2013-08-29 DIAGNOSIS — I2510 Atherosclerotic heart disease of native coronary artery without angina pectoris: Secondary | ICD-10-CM | POA: Insufficient documentation

## 2013-08-29 DIAGNOSIS — Z87891 Personal history of nicotine dependence: Secondary | ICD-10-CM | POA: Insufficient documentation

## 2013-08-29 DIAGNOSIS — Z9861 Coronary angioplasty status: Secondary | ICD-10-CM | POA: Insufficient documentation

## 2013-08-29 DIAGNOSIS — Z23 Encounter for immunization: Secondary | ICD-10-CM | POA: Insufficient documentation

## 2013-08-29 DIAGNOSIS — I10 Essential (primary) hypertension: Secondary | ICD-10-CM | POA: Insufficient documentation

## 2013-08-29 DIAGNOSIS — S8990XA Unspecified injury of unspecified lower leg, initial encounter: Secondary | ICD-10-CM | POA: Insufficient documentation

## 2013-08-29 DIAGNOSIS — IMO0002 Reserved for concepts with insufficient information to code with codable children: Secondary | ICD-10-CM | POA: Insufficient documentation

## 2013-08-29 LAB — POCT I-STAT, CHEM 8
BUN: 21 mg/dL (ref 6–23)
Calcium, Ion: 1.15 mmol/L (ref 1.13–1.30)
Creatinine, Ser: 1.7 mg/dL — ABNORMAL HIGH (ref 0.50–1.35)
Hemoglobin: 15.6 g/dL (ref 13.0–17.0)
Sodium: 140 mEq/L (ref 135–145)
TCO2: 26 mmol/L (ref 0–100)

## 2013-08-29 MED ORDER — TETANUS-DIPHTH-ACELL PERTUSSIS 5-2.5-18.5 LF-MCG/0.5 IM SUSP
0.5000 mL | Freq: Once | INTRAMUSCULAR | Status: AC
  Administered 2013-08-29: 0.5 mL via INTRAMUSCULAR
  Filled 2013-08-29: qty 0.5

## 2013-08-29 NOTE — ED Provider Notes (Signed)
CSN: [id number]     Arrival date & time 08/29/13  1205 History   First MD Initiated Contact with Patient 08/29/13 1236     Chief Complaint  Patient presents with  . Toe Injury, uncontrolled bleeding    (Consider location/radiation/quality/duration/timing/severity/associated sxs/prior Treatment) Patient is a 67 y.o. male presenting with toe pain. The history is provided by the patient.  Toe Pain This is a new problem. The current episode started today. The problem occurs constantly. The problem has been gradually improving. Pertinent negatives include no abdominal pain, chest pain, chills, congestion, coughing, fatigue, fever, headaches or myalgias. Nothing aggravates the symptoms. He has tried nothing for the symptoms.   Patient had bleeding after stubbing toe on left foot on night stool. He is on Effient and ASA for DES implanted 12-2012. Seen in urgent care who sent to ED.  Past Medical History  Diagnosis Date  . Coronary artery disease     Cardiac cath March 2013 with DES placed Circumflex and DES x 2  in the LAD  . Hypertension   . AAA (abdominal aortic aneurysm)     Aortic stent graft  . Carotid artery disease     s/p right CEA and right carotid (stent 11/09). Carotid dopplers 06/27/11 with RICA patent with 143cm/sec velocity, LICA known occlusion  . PAD (peripheral artery disease)     s/p abdominal aortic endograft with left to right fem-pop bypass. S/p  stenting left external iliac artery 12/17/08. CTA 11/12 with occlusion right limb of graft with patent left to righ fem-fem graft. Mild SFA disease bilaterally.    Past Surgical History  Procedure Laterality Date  . Coronary angioplasty    . Carotid endarterectomy      Right  . Aortic stent graft     Family History  Problem Relation Age of Onset  . Cancer Mother 47    breast/lung  . Lymphoma Father 4   History  Substance Use Topics  . Smoking status: Former Smoker -- 1.00 packs/day for 50 years    Types: Cigarettes   Quit date: 01/14/2012  . Smokeless tobacco: Not on file     Comment: Has been  trying to quit  . Alcohol Use: No    Review of Systems  Constitutional: Negative for fever, chills and fatigue.  HENT: Negative for congestion.   Respiratory: Negative for cough.   Cardiovascular: Negative for chest pain.  Gastrointestinal: Negative for abdominal pain.  Musculoskeletal: Negative for myalgias.  Neurological: Negative for headaches.    Allergies  Codeine  Home Medications   Current Outpatient Rx  Name  Route  Sig  Dispense  Refill  . allopurinol (ZYLOPRIM) 300 MG tablet   Oral   Take 300 mg by mouth daily.         Marland Kitchen amitriptyline (ELAVIL) 25 MG tablet   Oral   Take 25 mg by mouth at bedtime.         Marland Kitchen aspirin 81 MG tablet   Oral   Take 1 tablet (81 mg total) by mouth daily.         . carvedilol (COREG) 12.5 MG tablet   Oral   Take 12.5 mg by mouth 2 (two) times daily with a meal.         . docusate sodium (COLACE) 100 MG capsule   Oral   Take 200 mg by mouth daily.         . fish oil-omega-3 fatty acids 1000 MG capsule   Oral  Take 1 g by mouth 2 (two) times daily.         . hydrALAZINE (APRESOLINE) 25 MG tablet   Oral   Take 25 mg by mouth 2 (two) times daily.         Marland Kitchen HYDROcodone-acetaminophen (NORCO) 10-325 MG per tablet   Oral   Take 1 tablet by mouth every 6 (six) hours as needed for moderate pain. Patient took 1/2 tablet today         . levothyroxine (SYNTHROID, LEVOTHROID) 137 MCG tablet   Oral   Take 137 mcg by mouth daily.         . Methylcellulose, Laxative, (CITRUCEL) 500 MG TABS   Oral   Take 500 mg by mouth daily.          . Multiple Vitamin (MULTIVITAMIN) tablet   Oral   Take 1 tablet by mouth daily.         . NON FORMULARY   Oral   Take 1 tablet by mouth daily with lunch. Study Drug Evacetrapib         . pantoprazole (PROTONIX) 40 MG tablet   Oral   Take 40 mg by mouth 2 (two) times daily.         Marland Kitchen  PARoxetine (PAXIL) 20 MG tablet   Oral   Take 10 mg by mouth daily.          . prasugrel (EFFIENT) 10 MG TABS   Oral   Take 1 tablet (10 mg total) by mouth daily.   30 tablet   11   . simvastatin (ZOCOR) 80 MG tablet   Oral   Take 40 mg by mouth at bedtime.         . nitroGLYCERIN (NITROSTAT) 0.4 MG SL tablet   Sublingual   Place 0.4 mg under the tongue every 5 (five) minutes as needed for chest pain.          BP 178/82  Pulse 56  SpO2 98% Physical Exam  Constitutional: He is oriented to person, place, and time. He appears well-developed and well-nourished. No distress.  HENT:  Head: Normocephalic.  Mouth/Throat: Oropharynx is clear and moist. No oropharyngeal exudate.  Eyes: EOM are normal. Pupils are equal, round, and reactive to light.  Musculoskeletal: He exhibits no edema and no tenderness.  Oozing wound to toe on left foot. Trauma to nail bed. Oozing ceases with pressure on nail.   Neurological: He is alert and oriented to person, place, and time.  Skin: He is not diaphoretic.  Psychiatric: He has a normal mood and affect. His behavior is normal.    ED Course  Procedures (including critical care time) Labs Review Labs Reviewed  POCT I-STAT, CHEM 8 - Abnormal; Notable for the following:    Creatinine, Ser 1.70 (*)    Glucose, Bld 105 (*)    All other components within normal limits   Imaging Review No results found.  EKG Interpretation   None       MDM   1. Toe trauma, left, initial encounter     1. Toe pain The patients toe stopped bleeding after tamponade application via toe splinting and taping. Hemostasis achieved. Hemoglobin is stable. Recommended patient to have f/u with PCP and podiatrist early next week. Patient agrees with this plan.     Pleas Koch, MD 08/29/13 1429

## 2013-08-29 NOTE — ED Notes (Addendum)
Pt sent from urgent care, smashed left 3rd toe. Pt on plavix, bleeding not controlled so pt sent to ED Left foot wrapped in gauze, bleeding appears to be controlled at this time

## 2013-08-29 NOTE — ED Provider Notes (Signed)
I saw and evaluated the patient, reviewed the resident's note and I agree with the findings and plan.  EKG Interpretation   None       Bleeding controlled.  Splint applied by orthopedic tech.  No change to his medications.  He understands return to the ER for new or worsening symptoms  Lyanne Co, MD 08/29/13 1436

## 2013-09-13 ENCOUNTER — Encounter: Payer: Self-pay | Admitting: Cardiovascular Disease

## 2013-09-14 ENCOUNTER — Other Ambulatory Visit: Payer: Self-pay | Admitting: *Deleted

## 2013-09-14 DIAGNOSIS — E039 Hypothyroidism, unspecified: Secondary | ICD-10-CM

## 2013-09-14 MED ORDER — LEVOTHYROXINE SODIUM 150 MCG PO TABS: 150.0000 ug | ORAL_TABLET | Freq: Every day | ORAL | Status: DC

## 2013-12-27 IMAGING — CR DG CHEST 1V PORT
1 series · 1 of 1 positions shown · non-contrast
Comparison: 01/16/2012

CLINICAL DATA: Assess ET tube placement.

PORTABLE CHEST - 1 VIEW

[AP]
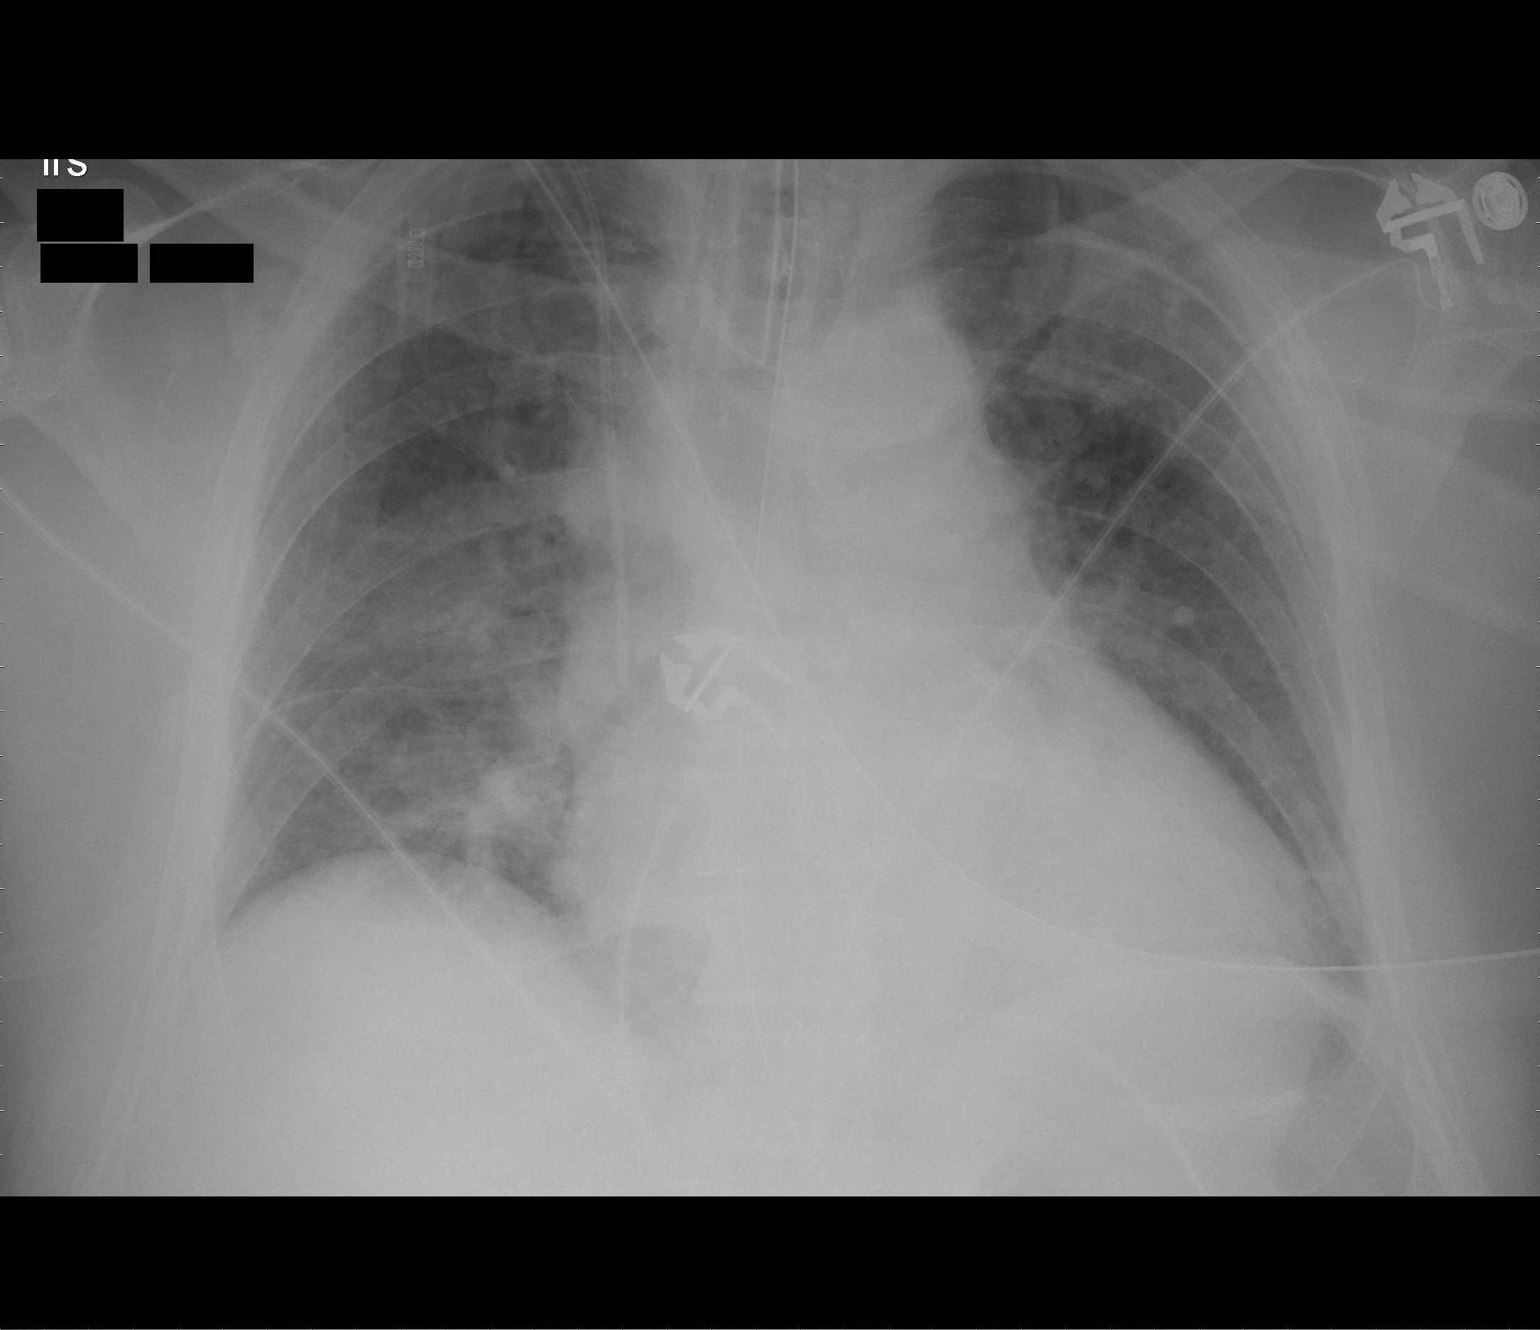

[1 of 1 positions shown; findings below may reference images not displayed]

FINDINGS: Support devices are stable.  Cardiomegaly.  Increasing
vascular congestion.  Suspect mild interstitial edema.  Increasing
bibasilar atelectasis or infiltrates.
IMPRESSION: Worsening bilateral perihilar opacities and vascular congestion,
suspect mild edema.

Increasing bibasilar atelectasis or infiltrates.

## 2013-12-28 IMAGING — CR DG CHEST 1V PORT
1 series · 1 of 1 positions shown · non-contrast
Comparison: 01/18/2012

CLINICAL DATA: Line placement.

PORTABLE CHEST - 1 VIEW

[view not recorded]
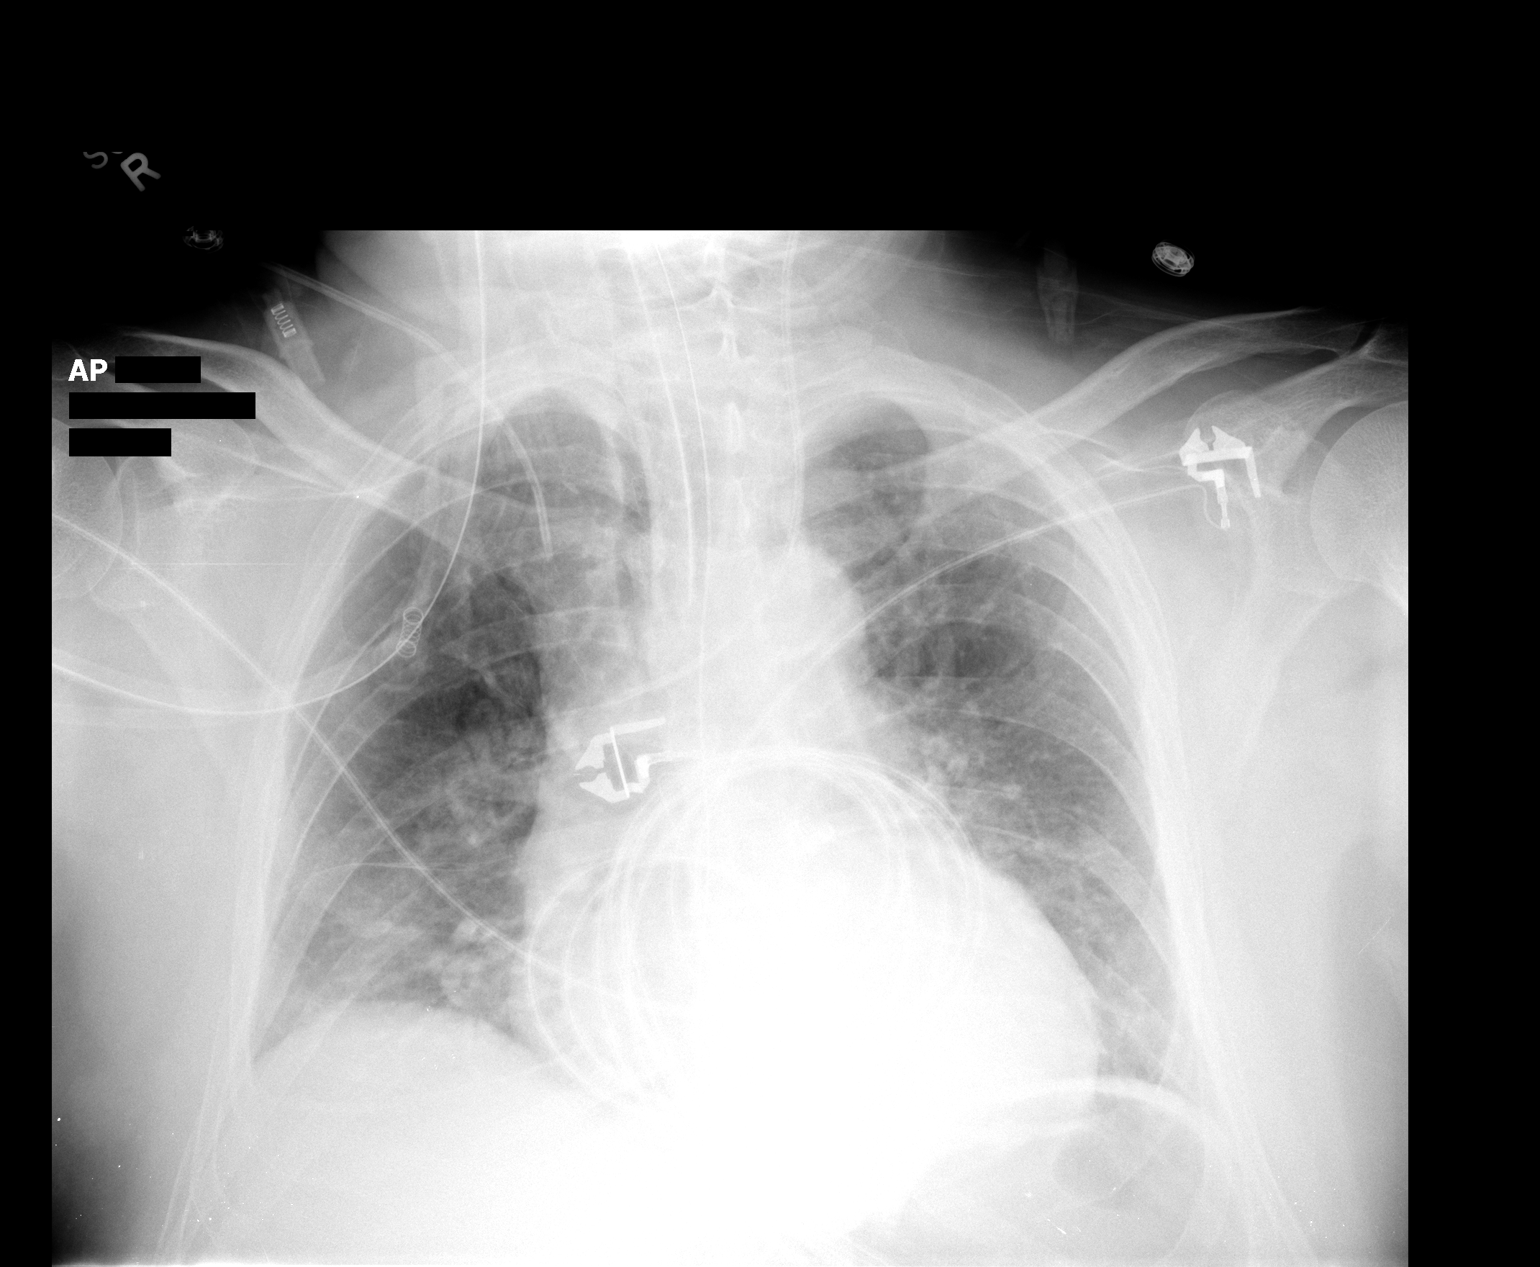

[1 of 1 positions shown; findings below may reference images not displayed]

FINDINGS: Left central line has been placed.  The tip is at the
confluence of the innominate veins.  Right central line has been
retracted into the right innominate vein.  Endotracheal tube and NG
tube are unchanged.  There is cardiomegaly with vascular
congestion.  Bibasilar atelectasis.  No effusions.
IMPRESSION: Cardiomegaly, vascular congestion.

Left central line tip at the confluence of the innominate veins.
Right central line has been retracted into the right innominate
vein.  No pneumothorax.

## 2013-12-29 IMAGING — CR DG CHEST 1V PORT
1 series · 1 of 1 positions shown · non-contrast
Comparison: the previous day's study

CLINICAL DATA: Intubated

PORTABLE CHEST - 1 VIEW

[AP]
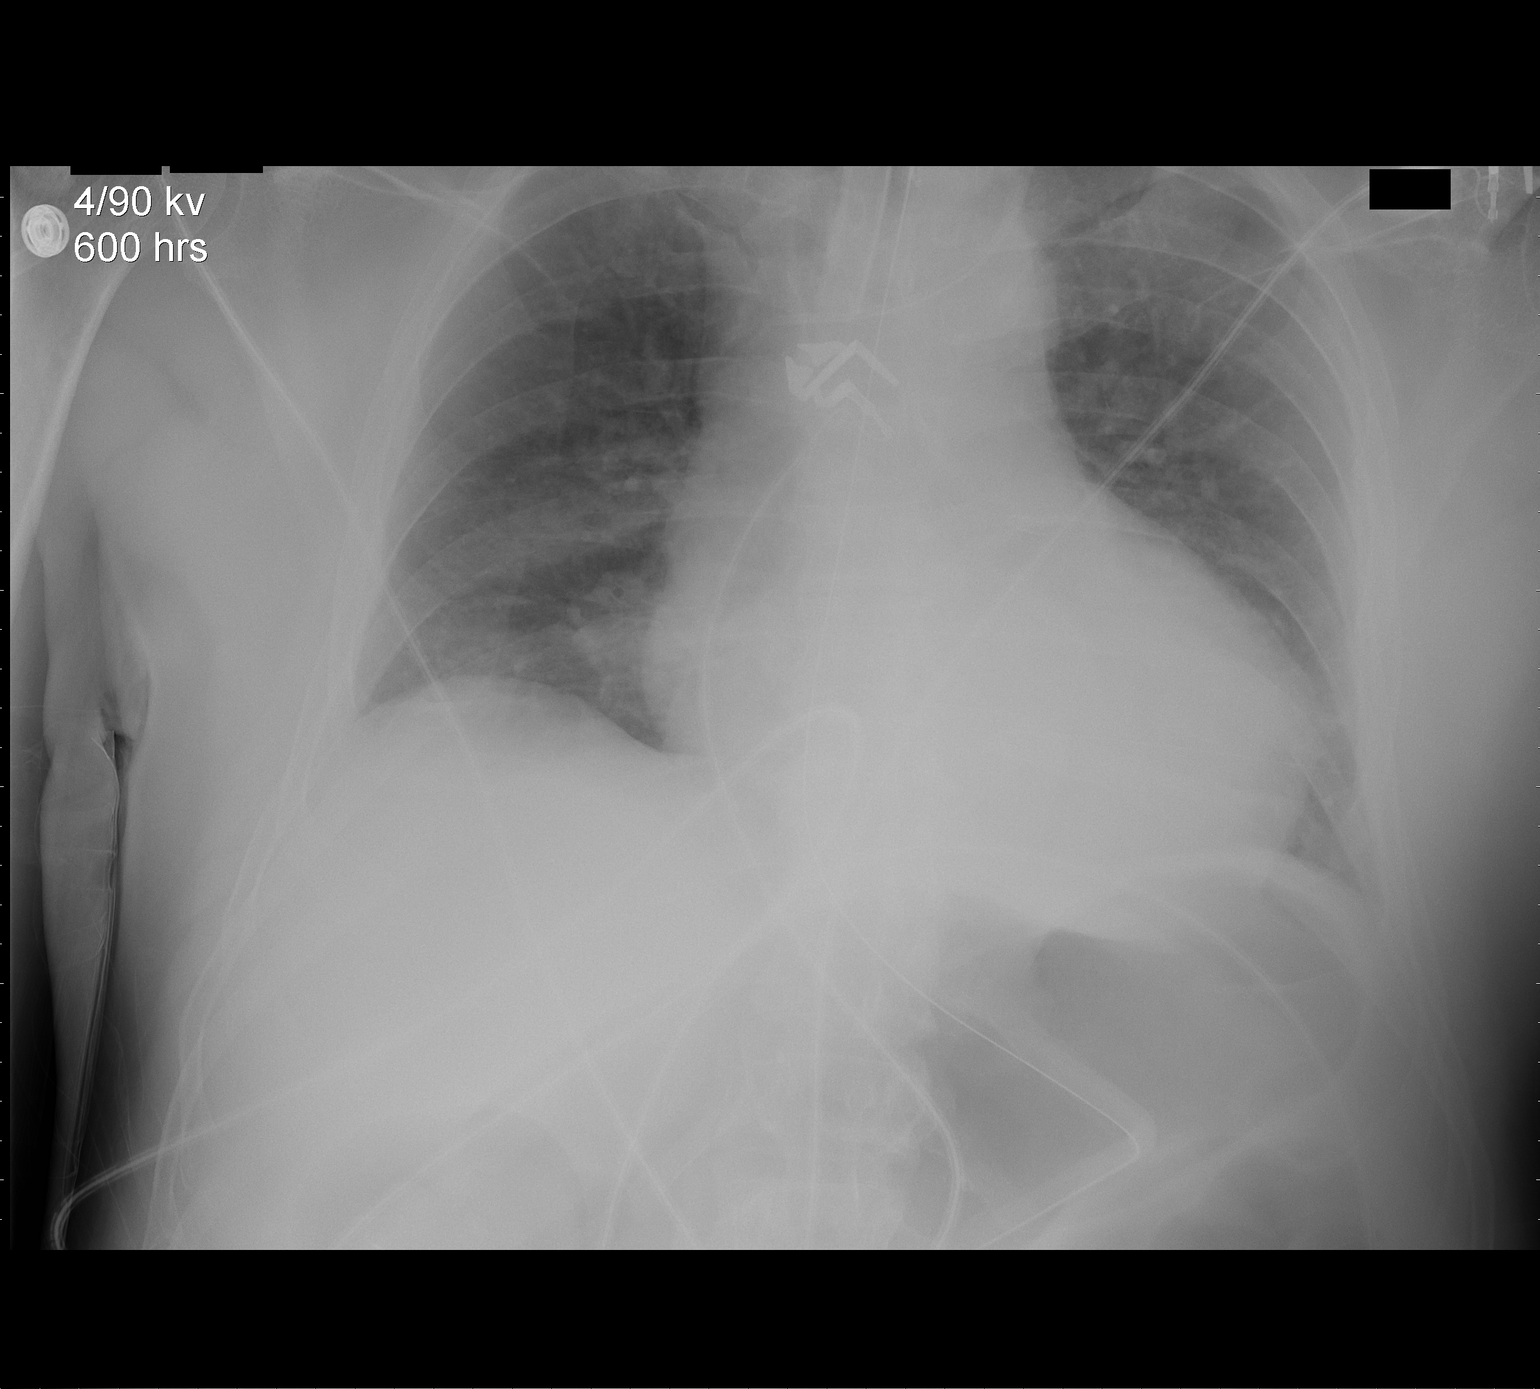

[1 of 1 positions shown; findings below may reference images not displayed]

FINDINGS: Endotracheal tube, nasogastric tube, and left IJ central
line are stable in position.  The right IJ catheter has been
removed.  Mild cardiomegaly.  Low lung volumes without focal
infiltrate or overt edema.  No definite effusion.
IMPRESSION: 1. Support hardware projects in expected location.

## 2013-12-30 IMAGING — CR DG CHEST 1V PORT
1 series · 1 of 1 positions shown · non-contrast
Comparison: the previous day's study

CLINICAL DATA: Intubated.

PORTABLE CHEST - 1 VIEW

[view not recorded]
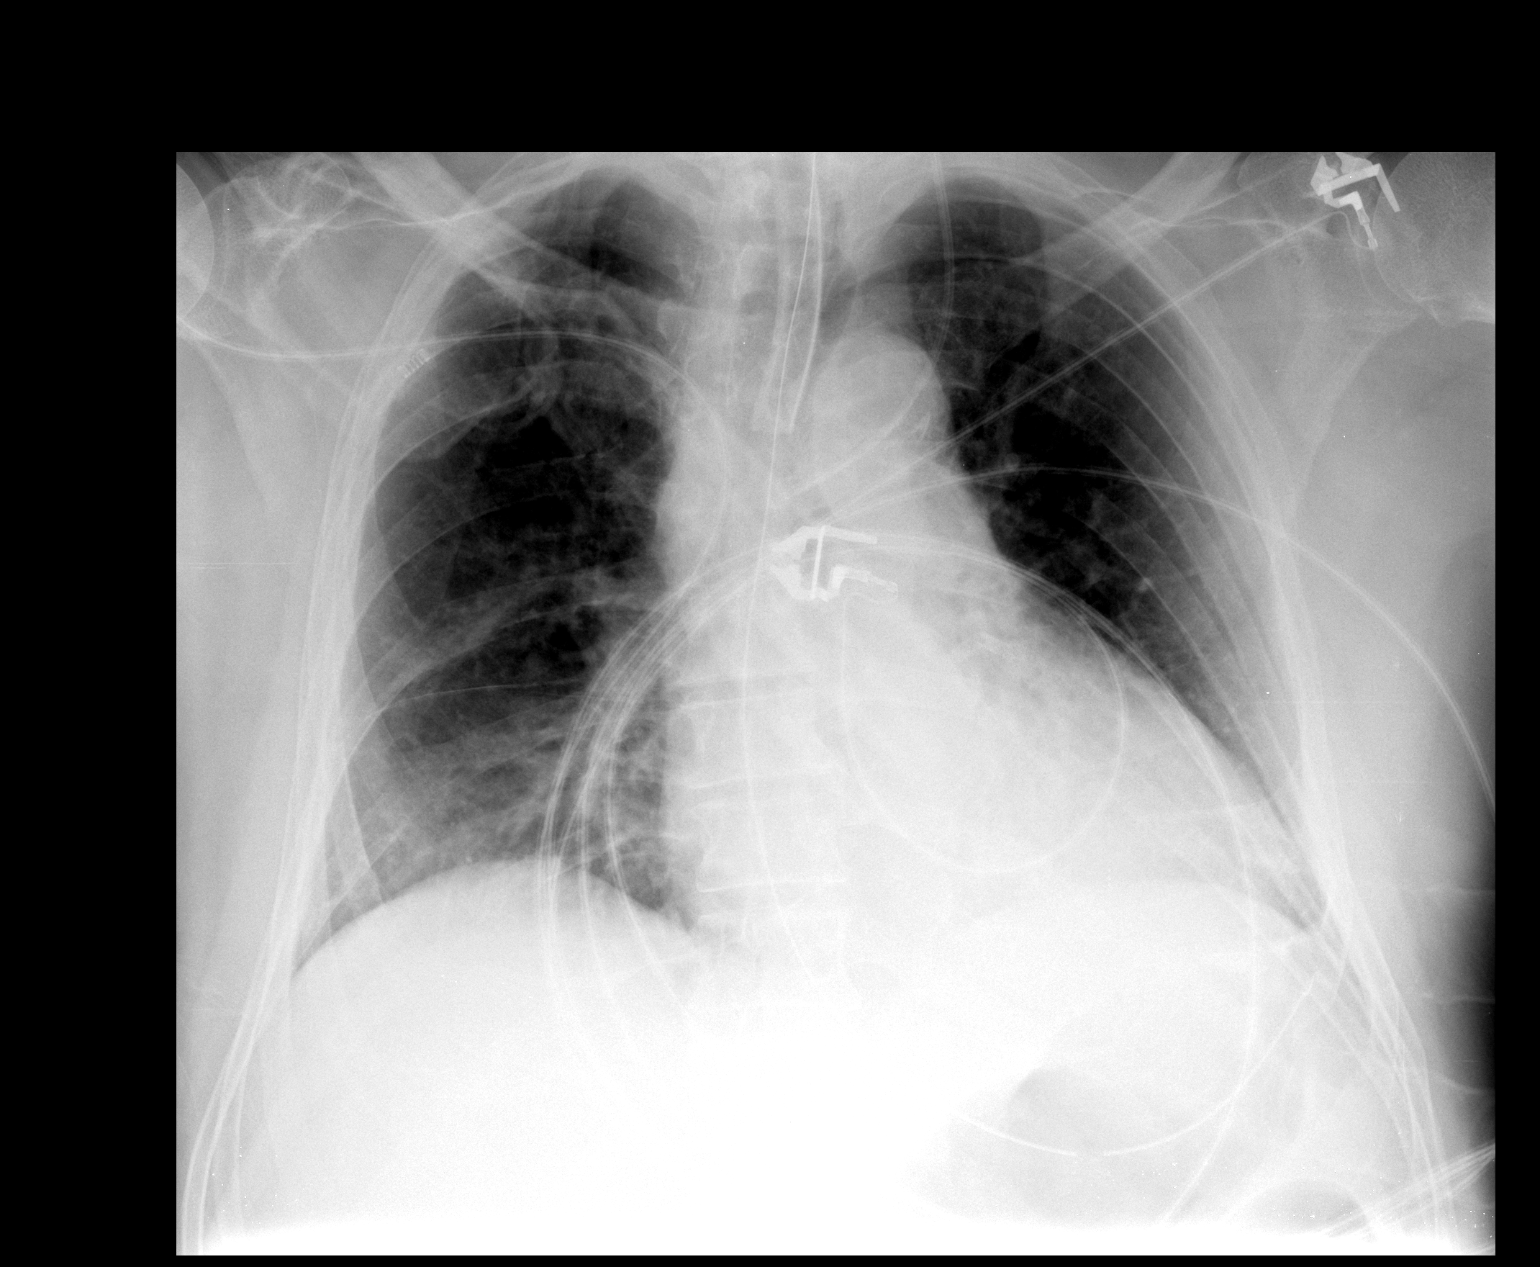

[1 of 1 positions shown; findings below may reference images not displayed]

FINDINGS: Endotracheal tube, nasogastric tube, and left IJ central
line remain in place.  Heart size upper limits normal for
technique.  Mild patchy subsegmental atelectasis or infiltrates in
the lung bases without convincing change.  No definite effusion.
IMPRESSION: Stable appearance since previous day's portable exam.

## 2014-01-01 IMAGING — CR DG CHEST 1V PORT
1 series · 1 of 1 positions shown · non-contrast
Comparison: [DATE] 01/20/2012

CLINICAL DATA: Acute respiratory failure.  Cardiogenic shock.

PORTABLE CHEST - 1 VIEW

[AP]
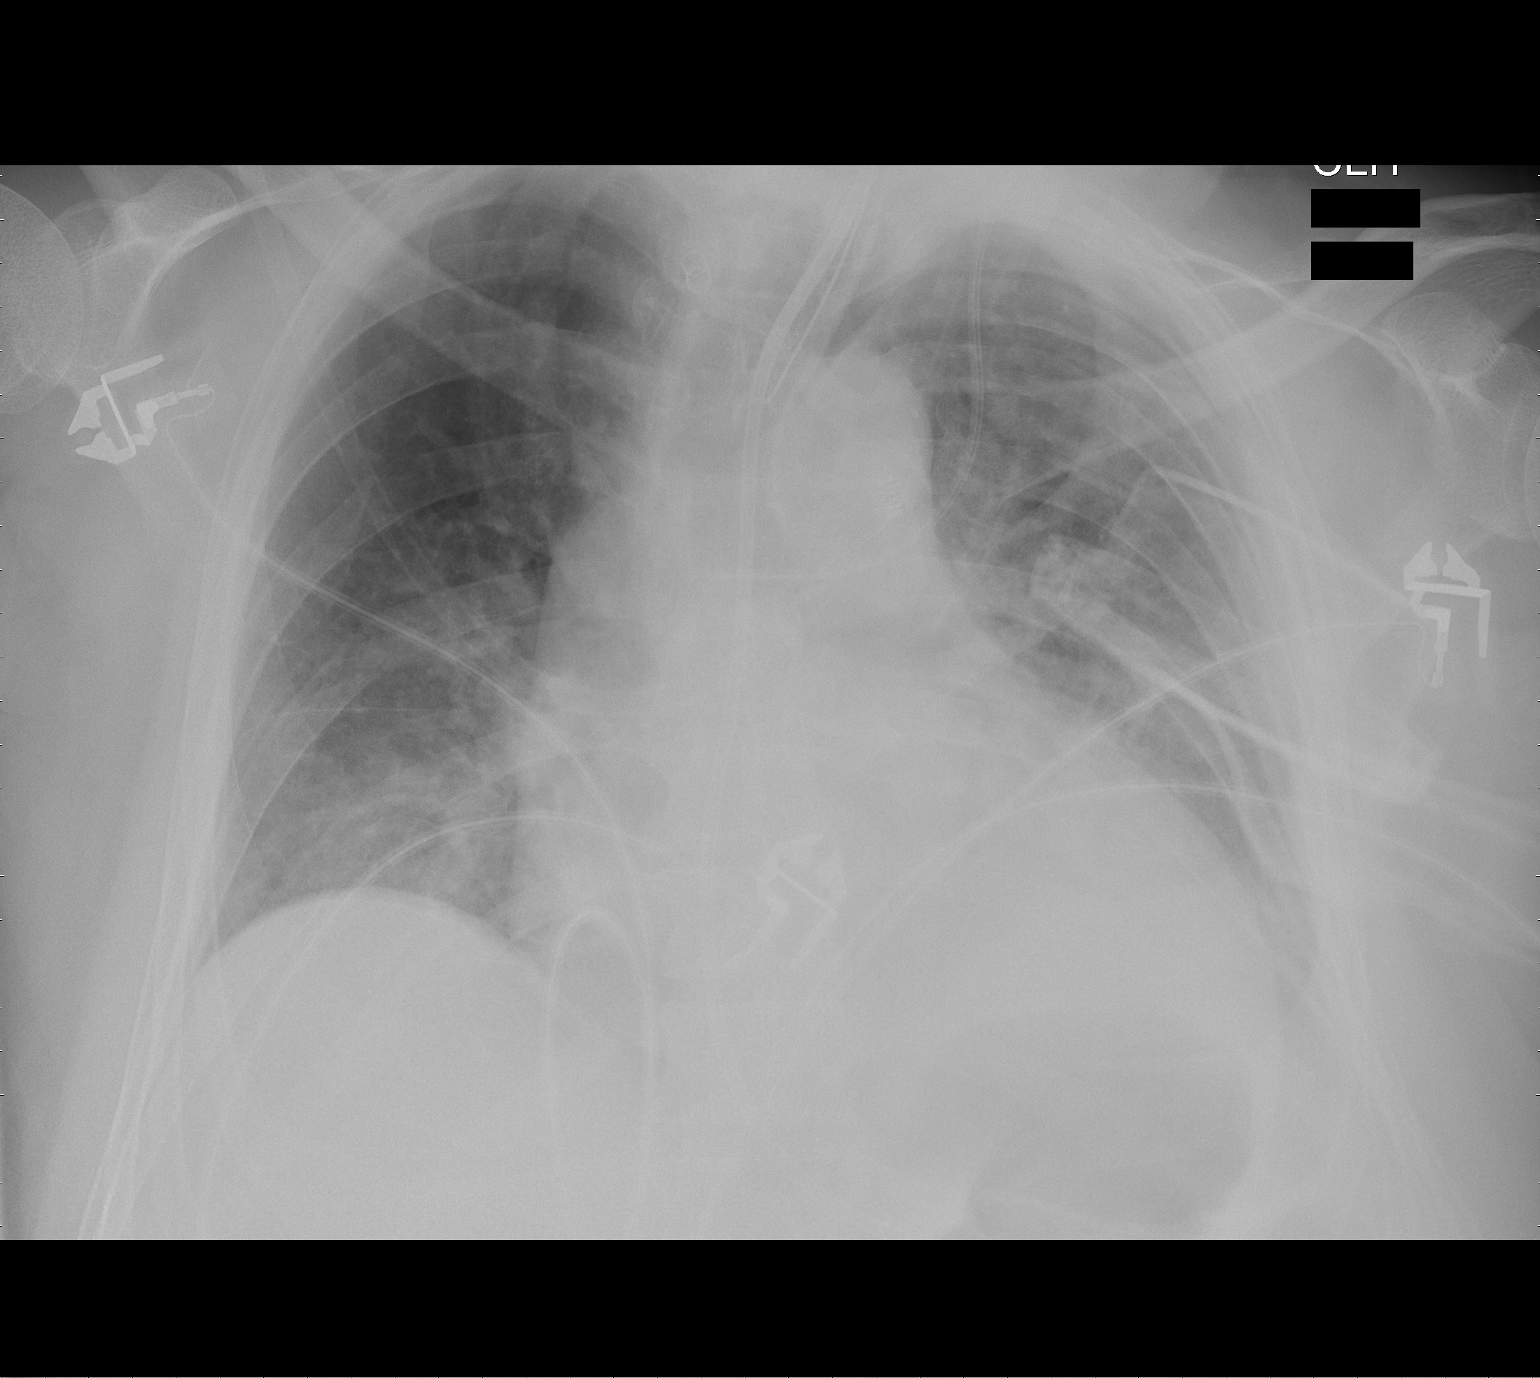

[1 of 1 positions shown; findings below may reference images not displayed]

FINDINGS: Endotracheal tube, feeding tube, and central venous
catheter appear unchanged.  Heart size and vascularity are within
normal limits.  New increased density at the left lung base is
probably atelectasis.  Minimal haziness at the right base may
represent a tiny effusion.
IMPRESSION: New left base atelectasis.  Possible tiny right effusion.

## 2014-01-18 ENCOUNTER — Encounter: Payer: Self-pay | Admitting: Cardiovascular Disease

## 2014-01-18 ENCOUNTER — Ambulatory Visit (INDEPENDENT_AMBULATORY_CARE_PROVIDER_SITE_OTHER): Payer: Medicare Other | Admitting: Cardiovascular Disease

## 2014-01-18 VITALS — BP 128/77 | HR 56 | Ht 67.0 in | Wt 184.0 lb

## 2014-01-18 DIAGNOSIS — I739 Peripheral vascular disease, unspecified: Secondary | ICD-10-CM

## 2014-01-18 DIAGNOSIS — I2510 Atherosclerotic heart disease of native coronary artery without angina pectoris: Secondary | ICD-10-CM

## 2014-01-18 DIAGNOSIS — I779 Disorder of arteries and arterioles, unspecified: Secondary | ICD-10-CM

## 2014-01-18 DIAGNOSIS — I10 Essential (primary) hypertension: Secondary | ICD-10-CM

## 2014-01-18 NOTE — Progress Notes (Signed)
History of Present Illness: 68 yo white male with a history of coronary artery disease, former tobacco abuse, PAD with cardiac cath March 2013 with severe disease in the LAD and Circumflex, now s/p DES x 1 Circumflex and DES x 2 LAD. He had chest pain and was admitted to [facility] on 01/14/12. His cardiac enzymes were negative. He was taken to the cath lab on 01/15/2012. He had a 3.5 x 12 mm Promus Element DES in the proximal Circumflex and a 3.0 x 16 mm Promus Element DES was deployed in the mid-LAD. He tolerated the procedure well. However, post cath he had onset of chest pain. His ECG showed ST elevation in the precordial leads and lateral depression. An emergency re-catheterization was performed and showed that the LAD was occluded proximal to the stent, likely from dissection of the vessel because of edge tear on front end of stent. I was able to open the vessel and placed a 3.0 x 20 mm Promus Element DES in the proximal LAD, proximal to the first stent in an overlapping fashion. He went into cardiogenic shock on the cath table and was intubated. He was hypotensive and started on multiple pressors. He was critically ill in the CCU for many days on pressors. He had ventilator dependent respiratory failure. Because of his history of AAA surgery, no balloon pump was used. His Plavix was discontinued and he was started on Brilinta. An echocardiogram showed preserved left ventricular function. He made a quick recovery after extubation and was discharged home without any supportive measures. His Brilinta was changed to Effient secondary to dyspnea and this improved. Carotid artery dopplers 07/16/13 with stable 60-79% RICA stenosis, occluded LICA.   He is here today for follow up. He has no chest pain. No pain in his legs. He has been active. No near syncope or syncope.   Primary Care Physician: Cleta Alberts, MD [facility])  Last Lipid Profile: January in [place] study   Past Medical  History  Diagnosis Date  . Coronary artery disease     Cardiac cath March 2013 with DES placed Circumflex and DES x 2  in the LAD  . Hypertension   . AAA (abdominal aortic aneurysm)     Aortic stent graft  . Carotid artery disease     s/p right CEA and right carotid (stent 11/09). Carotid dopplers 06/27/11 with RICA patent with 143cm/sec velocity, LICA known occlusion  . PAD (peripheral artery disease)     s/p abdominal aortic endograft with left to right fem-pop bypass. S/p  stenting left external iliac artery 12/17/08. CTA 11/12 with occlusion right limb of graft with patent left to righ fem-fem graft. Mild SFA disease bilaterally.     Past Surgical History  Procedure Laterality Date  . Coronary angioplasty    . Carotid endarterectomy      Right  . Aortic stent graft      Current Outpatient Prescriptions  Medication Sig Dispense Refill  . amitriptyline (ELAVIL) 25 MG tablet Take 25 mg by mouth at bedtime.      Marland Kitchen aspirin 81 MG tablet Take 1 tablet (81 mg total) by mouth daily.      Marland Kitchen docusate sodium (COLACE) 100 MG capsule Take 200 mg by mouth daily.      . fish oil-omega-3 fatty acids 1000 MG capsule Take 1 g by mouth 2 (two) times daily.      Marland Kitchen HYDROcodone-acetaminophen (NORCO) 10-325 MG per tablet Take 1 tablet by mouth  every 6 (six) hours as needed for moderate pain. Patient took 1/2 tablet today      . levothyroxine (SYNTHROID, LEVOTHROID) 175 MCG tablet Take 175 mcg by mouth daily before breakfast.      . Methylcellulose, Laxative, (CITRUCEL) 500 MG TABS Take 500 mg by mouth daily.       . Multiple Vitamin (MULTIVITAMIN) tablet Take 1 tablet by mouth daily.      . nitroGLYCERIN (NITROSTAT) 0.4 MG SL tablet Place 0.4 mg under the tongue every 5 (five) minutes as needed for chest pain.      . NON FORMULARY Take 1 tablet by mouth daily with lunch. Study Drug Evacetrapib      . pantoprazole (PROTONIX) 40 MG tablet Take 40 mg by mouth 2 (two) times daily.      Marland Kitchen PARoxetine (PAXIL)  20 MG tablet Take 10 mg by mouth daily.       . prasugrel (EFFIENT) 10 MG TABS Take 1 tablet (10 mg total) by mouth daily.  30 tablet  11  . simvastatin (ZOCOR) 80 MG tablet Take 40 mg by mouth at bedtime.       No current facility-administered medications for this visit.    Allergies  Allergen Reactions  . Codeine Itching    History   Social History  . Marital Status: Single    Spouse Name: N/A    Number of Children: 1  . Years of Education: N/A   Occupational History  .  [employer]   Social History Main Topics  . Smoking status: Former Smoker -- 1.00 packs/day for 50 years    Types: Cigarettes    Quit date: 01/14/2012  . Smokeless tobacco: Not on file     Comment: Has been  trying to quit  . Alcohol Use: No  . Drug Use: No  . Sexual Activity: Yes   Other Topics Concern  . Not on file   Social History Narrative  . No narrative on file    Family History  Problem Relation Age of Onset  . Cancer Mother 91    breast/lung  . Lymphoma Father 40    Review of Systems:  As stated in the HPI and otherwise negative.   BP 128/77  Pulse 56  Ht 5\' 7"  (1.702 m)  Wt 184 lb (83.462 kg)  BMI 28.81 kg/m2  Physical Examination: General: Well developed, well nourished, NAD HEENT: OP clear, mucus membranes moist SKIN: warm, dry. No rashes. Neuro: No focal deficits Musculoskeletal: Muscle strength 5/5 all ext Psychiatric: Mood and affect normal Neck: No JVD, rigth carotid bruit, no thyromegaly, no lymphadenopathy. Lungs:Clear bilaterally, no wheezes, rhonci, crackles Cardiovascular: Regular rate and rhythm. No murmurs, gallops or rubs. Abdomen:Soft. Bowel sounds present. Non-tender.  Extremities: No lower extremity edema. Pulses are 1 + in the bilateral DP/PT.   Assessment and Plan:   1. CAD: Stable. He is on ASA and Effient and will continue given stent thrombosis, severe PAD and carotid artery disease.  Will continue Coreg 12.5 mg po BID. Continue statin. Lipids  followed in research (ACCELERATE). Ace-inh stopped secondary to renal insufficiency. Will arrange Lexiscan stress myoview to exclude ischemia since it has been 2 years since his cardiac event.    2. PAD: Stable by dopplers July 2014. No leg pain.  Repeat ABI July 2015.   3. HTN: Well controlled at home. Continue Coreg 12.5 mg po BID and Hydralazine.   4. Carotid artery disease: He is known to have occluded LICA and  has had right CEA followed by RICA stent placement. Last dopplers Septebmer 2014 with stable disease.  Repeat carotid dopplers in July 2015.

## 2014-01-18 NOTE — Patient Instructions (Signed)
Your physician wants you to follow-up in:  6 months. You will receive a reminder letter in the mail two months in advance. If you don't receive a letter, please call our office to schedule the follow-up appointment.  Your physician has requested that you have a lexiscan myoview. For further information please visit [email or web address]. Please follow instruction sheet, as given. To be done now.   Your physician has requested that you have a carotid duplex. This test is an ultrasound of the carotid arteries in your neck. It looks at blood flow through these arteries that supply the brain with blood. Allow one hour for this exam. There are no restrictions or special instructions. To be done at the end of July on day of abi's.   Your physician has requested that you have an ankle brachial index (ABI). During this test an ultrasound and blood pressure cuff are used to evaluate the arteries that supply the arms and legs with blood. Allow thirty minutes for this exam. There are no restrictions or special instructions. To be done at the end of July on day of carotid doppler

## 2014-01-28 ENCOUNTER — Encounter: Payer: Self-pay | Admitting: Cardiology

## 2014-01-31 ENCOUNTER — Encounter: Payer: Self-pay | Admitting: Cardiovascular Disease

## 2014-02-08 ENCOUNTER — Ambulatory Visit (HOSPITAL_COMMUNITY): Payer: Medicare Other | Attending: Cardiovascular Disease | Admitting: Radiology

## 2014-02-08 VITALS — BP 178/82 | Ht 67.0 in | Wt 186.0 lb

## 2014-02-08 DIAGNOSIS — Z87891 Personal history of nicotine dependence: Secondary | ICD-10-CM | POA: Insufficient documentation

## 2014-02-08 DIAGNOSIS — R0989 Other specified symptoms and signs involving the circulatory and respiratory systems: Principal | ICD-10-CM | POA: Insufficient documentation

## 2014-02-08 DIAGNOSIS — I10 Essential (primary) hypertension: Secondary | ICD-10-CM | POA: Insufficient documentation

## 2014-02-08 DIAGNOSIS — I2510 Atherosclerotic heart disease of native coronary artery without angina pectoris: Secondary | ICD-10-CM | POA: Insufficient documentation

## 2014-02-08 DIAGNOSIS — R0609 Other forms of dyspnea: Secondary | ICD-10-CM | POA: Insufficient documentation

## 2014-02-08 DIAGNOSIS — R9431 Abnormal electrocardiogram [ECG] [EKG]: Secondary | ICD-10-CM

## 2014-02-08 DIAGNOSIS — I252 Old myocardial infarction: Secondary | ICD-10-CM | POA: Insufficient documentation

## 2014-02-08 MED ORDER — REGADENOSON 0.4 MG/5ML IV SOLN
0.4000 mg | Freq: Once | INTRAVENOUS | Status: AC
  Administered 2014-02-08: 0.4 mg via INTRAVENOUS

## 2014-02-08 MED ORDER — TECHNETIUM TC 99M SESTAMIBI GENERIC - CARDIOLITE
10.0000 | Freq: Once | INTRAVENOUS | Status: AC | PRN
  Administered 2014-02-08: 10 via INTRAVENOUS

## 2014-02-08 MED ORDER — TECHNETIUM TC 99M SESTAMIBI GENERIC - CARDIOLITE
30.0000 | Freq: Once | INTRAVENOUS | Status: AC | PRN
  Administered 2014-02-08: 30 via INTRAVENOUS

## 2014-02-08 NOTE — Progress Notes (Signed)
[facility] [address] NUCLEAR MED [address] [place], [place] [id number] [phone]    Cardiology Nuclear Med Study  Nathan Mcgee is a 68 y.o. male     MRN : [id number]     DOB: 1946-02-26  Procedure Date: 02/08/2014  Nuclear Med Background Indication for Stress Test:  Evaluation for Ischemia and Stent Patency History:  MPI: 5/6 yrs ago OK per pt, 01/24/12 ECHO: EF: 60-65% HP, CAD, MI-CATH CATH- Severe CAD '13 STENT: CFX, LAD x2 Cardiac Risk Factors: Carotid Disease, History of Smoking, Hypertension, Lipids and PVD  Symptoms:  DOE   Nuclear Pre-Procedure Caffeine/Decaff Intake:  None > 12 hrs NPO After: 9:00pm   Lungs: clear O2 Sat: 98% on room air. IV 0.9% NS with Angio Cath:  22g  IV Site: R Hand x 1, tolerated well IV Started by:  Irean HongPatsy Edwards, RN  Chest Size (in):  42 Cup Size: n/a  Height: 5\' 7"  (1.702 m)  Weight:  186 lb (84.369 kg)  BMI:  Body mass index is 29.12 kg/(m^2). Tech Comments:  Patient took carvedilol this am. Irean HongPatsy Edwards, RN    Nuclear Med Study 1 or 2 day study: 1 day  Stress Test Type:  Eugenie BirksLexiscan  Reading MD: N/A  Order Authorizing Provider:  Verne Carrowhristopher McAlhany, MD  Resting Radionuclide: Technetium 4657m Sestamibi  Resting Radionuclide Dose: 11.0 mCi   Stress Radionuclide:  Technetium 3457m Sestamibi  Stress Radionuclide Dose: 33.0 mCi           Stress Protocol Rest HR: 53 Stress HR: 70  Rest BP: 178/82 Stress BP: 169/82  Exercise Time (min): n/a METS: n/a   Predicted Max HR: 152 bpm % Max HR: 46.05 bpm Rate Pressure Product: [id number]   Dose of Adenosine (mg):  n/a Dose of Lexiscan: 0.4 mg  Dose of Atropine (mg): n/a Dose of Dobutamine: n/a mcg/kg/min (at max HR)  Stress Test Technologist: Milana NaSabrina Williams, EMT-P  Nuclear Technologist:  Domenic PoliteStephen Carbone, CNMT     Rest Procedure:  Myocardial perfusion imaging was performed at rest 45 minutes following the intravenous administration of Technetium 6157m Sestamibi. Rest ECG:  SB 55 bpm  LVH   Anteroseptal MI  ST T wave chnages consistent with strain pattern.    Stress Procedure:  The patient received IV Lexiscan 0.4 mg over 15-seconds.  Technetium 857m Sestamibi injected at 30-seconds. This patient had sob, chest tightness, and a headache with the Lexiscan injection. Quantitative spect images were obtained after a 45 minute delay. Stress MVH:[id number]:Nondiagnostic due to baseline changes  QPS Raw Data Images:  Soft tissue (diaphragm, bowel acitvity) underlie heart.   Stress Images:  Defect in the mid/distal anteroseptal, distal anterior, apical walls.   Rest Images:  Comparison with the stress images reveals no significant change. Subtraction (SDS):  No evidence of ischemia. Transient Ischemic Dilatation (Normal <1.22):  1.04 Lung/Heart Ratio (Normal <0.45):  0.30  Quantitative Gated Spect Images QGS EDV:  145 ml QGS ESV:  77 ml  Impression Exercise Capacity:  Lexiscan with no exercise. BP Response:  Normal blood pressure response. Clinical Symptoms:  Mild chest pain/dyspnea. ECG Impression:  Nondiagnostic due to baseline changes.   Comparison with Prior Nuclear Study: No prior study to compare.  Overall Impression:  Scar in the mid/distal anteroseptal, distal anteirior and apical walls.  No ischemia.    LV Ejection Fraction: 47%  LV Wall Motion:  Septal and apical hypokinesis.    Pricilla RifflePaula V Camrin Gearheart

## 2014-02-11 ENCOUNTER — Telehealth: Payer: Self-pay | Admitting: Cardiovascular Disease

## 2014-02-11 NOTE — Telephone Encounter (Signed)
Spoke with pt and reviewed stress test results with him.  

## 2014-02-11 NOTE — Telephone Encounter (Signed)
Patient is returning your call. Please call him @ [phone]

## 2014-05-17 ENCOUNTER — Encounter (HOSPITAL_COMMUNITY): Payer: Medicare Other

## 2014-05-24 ENCOUNTER — Ambulatory Visit (HOSPITAL_BASED_OUTPATIENT_CLINIC_OR_DEPARTMENT_OTHER): Payer: Medicare Other | Admitting: Cardiology

## 2014-05-24 ENCOUNTER — Ambulatory Visit (HOSPITAL_COMMUNITY): Payer: Medicare Other | Attending: Cardiology | Admitting: Cardiology

## 2014-05-24 DIAGNOSIS — Z87891 Personal history of nicotine dependence: Secondary | ICD-10-CM | POA: Diagnosis not present

## 2014-05-24 DIAGNOSIS — I6529 Occlusion and stenosis of unspecified carotid artery: Secondary | ICD-10-CM | POA: Diagnosis not present

## 2014-05-24 DIAGNOSIS — I739 Peripheral vascular disease, unspecified: Secondary | ICD-10-CM | POA: Diagnosis not present

## 2014-05-24 DIAGNOSIS — E785 Hyperlipidemia, unspecified: Secondary | ICD-10-CM | POA: Insufficient documentation

## 2014-05-24 DIAGNOSIS — I779 Disorder of arteries and arterioles, unspecified: Secondary | ICD-10-CM

## 2014-05-24 DIAGNOSIS — I10 Essential (primary) hypertension: Secondary | ICD-10-CM | POA: Diagnosis not present

## 2014-05-24 NOTE — Progress Notes (Signed)
ABI performed  

## 2014-05-24 NOTE — Progress Notes (Signed)
Carotid duplex performed 

## 2014-06-18 ENCOUNTER — Encounter: Payer: Self-pay | Admitting: Cardiology

## 2014-08-11 ENCOUNTER — Ambulatory Visit (INDEPENDENT_AMBULATORY_CARE_PROVIDER_SITE_OTHER): Payer: Medicare Other | Admitting: Cardiovascular Disease

## 2014-08-11 ENCOUNTER — Encounter: Payer: Self-pay | Admitting: Cardiovascular Disease

## 2014-08-11 VITALS — BP 130/90 | HR 68 | Ht 67.0 in | Wt 187.0 lb

## 2014-08-11 DIAGNOSIS — I779 Disorder of arteries and arterioles, unspecified: Secondary | ICD-10-CM

## 2014-08-11 DIAGNOSIS — I2510 Atherosclerotic heart disease of native coronary artery without angina pectoris: Secondary | ICD-10-CM

## 2014-08-11 DIAGNOSIS — I739 Peripheral vascular disease, unspecified: Secondary | ICD-10-CM

## 2014-08-11 DIAGNOSIS — I10 Essential (primary) hypertension: Secondary | ICD-10-CM

## 2014-08-11 NOTE — Progress Notes (Signed)
History of Present Illness: 68 yo white male with a history of coronary artery disease, former tobacco abuse, PAD here today for cardiac follow up. Cardiac cath March 2013 with severe disease in the LAD and Circumflex, now s/p DES x 1 Circumflex and DES x 2 LAD. He had chest pain and was admitted to [facility] on 01/14/12. His cardiac enzymes were negative. He was taken to the cath lab on 01/15/2012. He had a 3.5 x 12 mm Promus Element DES in the proximal Circumflex and a 3.0 x 16 mm Promus Element DES was deployed in the mid-LAD. He tolerated the procedure well. However, post cath he had onset of chest pain. His ECG showed ST elevation in the precordial leads and lateral depression. An emergency re-catheterization was performed and showed that the LAD was occluded proximal to the stent, likely from dissection of the vessel because of edge tear on front end of stent. I was able to open the vessel and placed a 3.0 x 20 mm Promus Element DES in the proximal LAD, proximal to the first stent in an overlapping fashion. He went into cardiogenic shock on the cath table and was intubated. He was hypotensive and started on multiple pressors. He was critically ill in the CCU for many days on pressors. He had ventilator dependent respiratory failure. Because of his history of AAA surgery, no balloon pump was used. His Plavix was discontinued and he was started on Brilinta. An echocardiogram showed preserved left ventricular function. He made a quick recovery after extubation and was discharged home without any supportive measures. His Brilinta was changed to Effient secondary to dyspnea and this improved. Carotid artery dopplers August 2015 with stable 60-79% RICA stenosis, occluded LICA. 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 ABI stable August 2015.   He is here today for follow up. He has no chest pain. No pain in his legs. He has been active. No near  syncope or syncope. Also c/o back pain. Describes morning fatigue.   Primary Care Physician: Cleta Albertsorazon Mulles, MD [facility]  Last Lipid Profile: Followed in ACCELERATE study   Past Medical History  Diagnosis Date  . Coronary artery disease     Cardiac cath March 2013 with DES placed Circumflex and DES x 2  in the LAD  . Hypertension   . AAA (abdominal aortic aneurysm)     Aortic stent graft  . Carotid artery disease     s/p right CEA and right carotid (stent 11/09). Carotid dopplers 06/27/11 with RICA patent with 143cm/sec velocity, LICA known occlusion  . PAD (peripheral artery disease)     s/p abdominal aortic endograft with left to right fem-pop bypass. S/p  stenting left external iliac artery 12/17/08. CTA 11/12 with occlusion right limb of graft with patent left to righ fem-fem graft. Mild SFA disease bilaterally.     Past Surgical History  Procedure Laterality Date  . Coronary angioplasty    . Carotid endarterectomy      Right  . Aortic stent graft      Current Outpatient Prescriptions  Medication Sig Dispense Refill  . allopurinol (ZYLOPRIM) 300 MG tablet Take 300 mg by mouth daily.      Marland Kitchen. amitriptyline (ELAVIL) 25 MG tablet Take 25 mg by mouth at bedtime.      Marland Kitchen. aspirin 81 MG tablet Take 1 tablet (81 mg total) by mouth daily.      .Marland Kitchen  carvedilol (COREG) 12.5 MG tablet Take 12.5 mg by mouth 2 (two) times daily with a meal.      . docusate sodium (COLACE) 100 MG capsule Take 200 mg by mouth daily.      . fish oil-omega-3 fatty acids 1000 MG capsule Take 1 g by mouth 2 (two) times daily.      . hydrALAZINE (APRESOLINE) 25 MG tablet Take 25 mg by mouth 2 (two) times daily.      Marland Kitchen. HYDROcodone-acetaminophen (NORCO) 10-325 MG per tablet Take 1 tablet by mouth every 6 (six) hours as needed for moderate pain. Patient took 1/2 tablet today      . levothyroxine (SYNTHROID, LEVOTHROID) 175 MCG tablet Take 150 mcg by mouth daily before breakfast.       . Methylcellulose,  Laxative, (CITRUCEL) 500 MG TABS Take 500 mg by mouth daily.       . Multiple Vitamin (MULTIVITAMIN) tablet Take 1 tablet by mouth daily.      . nitroGLYCERIN (NITROSTAT) 0.4 MG SL tablet Place 0.4 mg under the tongue every 5 (five) minutes as needed for chest pain.      . pantoprazole (PROTONIX) 40 MG tablet Take 40 mg by mouth 2 (two) times daily.      Marland Kitchen. PARoxetine (PAXIL) 20 MG tablet Take 10 mg by mouth daily.       . prasugrel (EFFIENT) 10 MG TABS Take 1 tablet (10 mg total) by mouth daily.  30 tablet  11  . simvastatin (ZOCOR) 80 MG tablet Take 40 mg by mouth at bedtime.       No current facility-administered medications for this visit.    Allergies  Allergen Reactions  . Codeine Itching    History   Social History  . Marital Status: Single    Spouse Name: N/A    Number of Children: 1  . Years of Education: N/A   Occupational History  .  [employer]   Social History Main Topics  . Smoking status: Former Smoker -- 1.00 packs/day for 50 years    Types: Cigarettes    Quit date: 01/14/2012  . Smokeless tobacco: Not on file     Comment: Has been  trying to quit  . Alcohol Use: No  . Drug Use: No  . Sexual Activity: Yes   Other Topics Concern  . Not on file   Social History Narrative  . No narrative on file    Family History  Problem Relation Age of Onset  . Cancer Mother 155    breast/lung  . Lymphoma Father [id number]    Review of Systems:  As stated in the HPI and otherwise negative.   BP 130/90  Pulse 68  Ht 5\' 7"  (1.702 m)  Wt 187 lb (84.823 kg)  BMI 29.28 kg/m2  Physical Examination: General: Well developed, well nourished, NAD HEENT: OP clear, mucus membranes moist SKIN: warm, dry. No rashes. Neuro: No focal deficits Musculoskeletal: Muscle strength 5/5 all ext Psychiatric: Mood and affect normal Neck: No JVD, right carotid bruit, no thyromegaly, no lymphadenopathy. Lungs:Clear bilaterally, no wheezes, rhonci, crackles Cardiovascular: Regular rate and  rhythm. No murmurs, gallops or rubs. Abdomen:Soft. Bowel sounds present. Non-tender.  Extremities: No lower extremity edema. Pulses are 1 + in the bilateral DP/PT.  Stress myoview April 2015: Rest Procedure: Myocardial perfusion imaging was performed at rest 45 minutes following the intravenous administration of Technetium 7944m Sestamibi.  Rest ECG: SB 55 bpm LVH Anteroseptal MI ST T wave chnages consistent with  strain pattern.  Stress Procedure: The patient received IV Lexiscan 0.4 mg over 15-seconds. Technetium 87m Sestamibi injected at 30-seconds. This patient had sob, chest tightness, and a headache with the Lexiscan injection. Quantitative spect images were obtained after a 45 minute delay.  Stress UJW:[id number] due to baseline changes  QPS  Raw Data Images: Soft tissue (diaphragm, bowel acitvity) underlie heart.  Stress Images: Defect in the mid/distal anteroseptal, distal anterior, apical walls.  Rest Images: Comparison with the stress images reveals no significant change.  Subtraction (SDS): No evidence of ischemia.  Transient Ischemic Dilatation (Normal <1.22): 1.04  Lung/Heart Ratio (Normal <0.45): 0.30  Quantitative Gated Spect Images  QGS EDV: 145 ml  QGS ESV: 77 ml  Impression  Exercise Capacity: Lexiscan with no exercise.  BP Response: Normal blood pressure response.  Clinical Symptoms: Mild chest pain/dyspnea.  ECG Impression: Nondiagnostic due to baseline changes.  Comparison with Prior Nuclear Study: No prior study to compare.  Overall Impression: Scar in the mid/distal anteroseptal, distal anteirior and apical walls. No ischemia.  LV Ejection Fraction: 47% LV Wall Motion: Septal and apical hypokinesis.   Assessment and Plan:   1. CAD: Stable. He is on ASA and Effient and will continue given stent thrombosis, severe PAD and carotid artery disease.  Will continue Coreg 12.5 mg po BID. Continue statin. Lipids followed in research (ACCELERATE). Ace-inh stopped secondary  to renal insufficiency. Stress myoview April 2015 without ischemia.   2. PAD: Stable by dopplers August 2015, Left 0.8, right 0.84 No leg pain.  Repeat ABI August 2016.  3. HTN: Erratic readings at home. 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 He will follow at home and send a MyChart message next week. Continue Coreg 12.5 mg po BID and Hydralazine.   4. Carotid artery disease: He is known to have occluded LICA and has had right CEA followed by RICA stent placement. Last dopplers August 2015 with stable disease.  Repeat carotid dopplers February 2016.

## 2014-08-11 NOTE — Patient Instructions (Signed)
Your physician wants you to follow-up in:  6 months.  You will receive a reminder letter in the mail two months in advance. If you don't receive a letter, please call our office to schedule the follow-up appointment.  Your physician has requested that you have a carotid duplex. This test is an ultrasound of the carotid arteries in your neck. It looks at blood flow through these arteries that supply the brain with blood. Allow one hour for this exam. There are no restrictions or special instructions. To be done in February 2016 (after Feb. 4)

## 2014-08-28 ENCOUNTER — Encounter: Payer: Self-pay | Admitting: Cardiology

## 2014-08-31 ENCOUNTER — Encounter: Payer: Self-pay | Admitting: Cardiovascular Disease

## 2014-09-02 ENCOUNTER — Encounter: Payer: Self-pay | Admitting: *Deleted

## 2014-09-02 ENCOUNTER — Other Ambulatory Visit: Payer: Self-pay | Admitting: *Deleted

## 2014-09-02 NOTE — Telephone Encounter (Signed)
This encounter was created in error - please disregard.

## 2014-09-03 ENCOUNTER — Telehealth: Payer: Self-pay | Admitting: *Deleted

## 2014-09-03 DIAGNOSIS — I10 Essential (primary) hypertension: Secondary | ICD-10-CM

## 2014-09-03 MED ORDER — AMLODIPINE BESYLATE 5 MG PO TABS: 5.0000 mg | ORAL_TABLET | Freq: Every day | ORAL | Status: DC

## 2014-09-03 MED ORDER — ATORVASTATIN CALCIUM 40 MG PO TABS: 40.0000 mg | ORAL_TABLET | Freq: Every day | ORAL | Status: DC

## 2014-09-03 NOTE — Telephone Encounter (Signed)
I caled patient about end of study visit Accelerate labs. K+ was elevated 5.696mEQ/L on 08/26/14. We repeated labs on 08/31/14 and K+ was 4.788mEQ/L. Patient creatinine was elevated at 1.58mg /dl and 1.60mg /dl. Dr Riley Killstuckey asked me to forward labs to Dr Clifton JamesMcAlhany.

## 2014-09-03 NOTE — Telephone Encounter (Signed)
Dr. Clifton JamesMcAlhany would like to start pt on Norvasc 5 mg by mouth daily based on recent blood pressure readings. Pt will need to stop simvastatin and start atorvastatin 40 mg by mouth daily.  Pt will also need BMP in 10 days.  I spoke with pt and gave him this information. He will come in on September 13, 2014 for labs.  [facility] Will send prescription for atorvastatin and amlodipine to [facility] in [place].  [place] [facility] from [place] and would like prescription sent to them at fax number [phone]. Attention of Dr. Marko StaiMulles.   Phone number for [facility] is [phone].  Will call [facility] to verify fax number next week ([facility] at this time).

## 2014-09-07 MED ORDER — AMLODIPINE BESYLATE 5 MG PO TABS: 5.0000 mg | ORAL_TABLET | Freq: Every day | ORAL | Status: DC

## 2014-09-07 MED ORDER — ATORVASTATIN CALCIUM 40 MG PO TABS: 40.0000 mg | ORAL_TABLET | Freq: Every day | ORAL | Status: AC

## 2014-09-07 NOTE — Addendum Note (Signed)
Addended by: Dossie ArbourADELMAN, Jahniah Pallas L on: 09/07/2014 09:21 AM   Modules accepted: Orders

## 2014-09-07 NOTE — Telephone Encounter (Signed)
Prescriptions faxed.  

## 2014-09-07 NOTE — Telephone Encounter (Signed)
[facility]oke with [facility] and fax number given by pt is not working. Prescriptions can be faxed to [phone]

## 2014-09-13 ENCOUNTER — Other Ambulatory Visit (INDEPENDENT_AMBULATORY_CARE_PROVIDER_SITE_OTHER): Payer: Medicare Other | Admitting: *Deleted

## 2014-09-13 DIAGNOSIS — I10 Essential (primary) hypertension: Secondary | ICD-10-CM

## 2014-09-13 LAB — BASIC METABOLIC PANEL
BUN: 15 mg/dL (ref 6–23)
CALCIUM: 8.8 mg/dL (ref 8.4–10.5)
CO2: 29 mEq/L (ref 19–32)
CREATININE: 1.6 mg/dL — AB (ref 0.4–1.5)
Chloride: 101 mEq/L (ref 96–112)
GFR: 47.14 mL/min — AB (ref >60.00)
GLUCOSE: 130 mg/dL — AB (ref 70–99)
POTASSIUM: 3.8 meq/L (ref 3.5–5.1)
Sodium: 137 mEq/L (ref 135–145)

## 2014-09-30 ENCOUNTER — Encounter (HOSPITAL_COMMUNITY): Payer: Self-pay | Admitting: Cardiovascular Disease

## 2014-10-11 ENCOUNTER — Encounter: Payer: Self-pay | Admitting: Cardiology

## 2014-11-29 ENCOUNTER — Ambulatory Visit (HOSPITAL_COMMUNITY): Payer: Medicare HMO | Attending: Cardiovascular Disease | Admitting: Cardiology

## 2014-11-29 DIAGNOSIS — I779 Disorder of arteries and arterioles, unspecified: Secondary | ICD-10-CM | POA: Diagnosis not present

## 2014-11-29 DIAGNOSIS — I6523 Occlusion and stenosis of bilateral carotid arteries: Secondary | ICD-10-CM | POA: Diagnosis present

## 2014-11-29 DIAGNOSIS — I739 Peripheral vascular disease, unspecified: Secondary | ICD-10-CM

## 2014-11-29 NOTE — Progress Notes (Signed)
Carotid duplex performed 

## 2015-03-11 ENCOUNTER — Encounter: Payer: Self-pay | Admitting: Cardiovascular Disease

## 2015-03-11 ENCOUNTER — Ambulatory Visit (INDEPENDENT_AMBULATORY_CARE_PROVIDER_SITE_OTHER): Payer: Medicare HMO | Admitting: Cardiovascular Disease

## 2015-03-11 VITALS — BP 120/74 | HR 61 | Ht 67.0 in | Wt 187.6 lb

## 2015-03-11 DIAGNOSIS — I779 Disorder of arteries and arterioles, unspecified: Secondary | ICD-10-CM | POA: Diagnosis not present

## 2015-03-11 DIAGNOSIS — I10 Essential (primary) hypertension: Secondary | ICD-10-CM

## 2015-03-11 DIAGNOSIS — I2510 Atherosclerotic heart disease of native coronary artery without angina pectoris: Secondary | ICD-10-CM | POA: Diagnosis not present

## 2015-03-11 DIAGNOSIS — I739 Peripheral vascular disease, unspecified: Secondary | ICD-10-CM

## 2015-03-11 NOTE — Patient Instructions (Addendum)
Medication Instructions:  Your physician recommends that you continue on your current medications as directed. Please refer to the Current Medication list given to you today.   Labwork: none  Testing/Procedures: Your physician has requested that you have an ankle brachial index (ABI). During this test an ultrasound and blood pressure cuff are used to evaluate the arteries that supply the arms and legs with blood. Allow thirty minutes for this exam. There are no restrictions or special instructions. To be done after May 31, 2015  Your physician has requested that you have a carotid duplex. This test is an ultrasound of the carotid arteries in your neck. It looks at blood flow through these arteries that supply the brain with blood. Allow one hour for this exam. There are no restrictions or special instructions. To be done after May 31, 2015    Follow-Up: Your physician wants you to follow-up in: 6 months.  You will receive a reminder letter in the mail two months in advance. If you don't receive a letter, please call our office to schedule the follow-up appointment.

## 2015-03-11 NOTE — Progress Notes (Signed)
Chief Complaint  Patient presents with  . Back Pain     History of Present Illness: 69 yo white male with a history of coronary artery disease, former tobacco abuse, PAD here today for cardiac follow up. Cardiac cath March 2013 with severe disease in the LAD and Circumflex, now s/p DES x 1 Circumflex and DES x 2 LAD. He had chest pain and was admitted to [facility] on 01/14/12. His cardiac enzymes were negative. He was taken to the cath lab on 01/15/2012. He had a 3.5 x 12 mm Promus Element DES in the proximal Circumflex and a 3.0 x 16 mm Promus Element DES was deployed in the mid-LAD. He tolerated the procedure well. However, post cath he had onset of chest pain. His ECG showed ST elevation in the precordial leads and lateral depression. An emergency re-catheterization was performed and showed that the LAD was occluded proximal to the stent, likely from dissection of the vessel because of edge tear on front end of stent. I was able to open the vessel and placed a 3.0 x 20 mm Promus Element DES in the proximal LAD, proximal to the first stent in an overlapping fashion. He went into cardiogenic shock on the cath table and was intubated. He was hypotensive and started on multiple pressors. He was critically ill in the CCU for many days on pressors. He had ventilator dependent respiratory failure. Because of his history of AAA surgery, no balloon pump was used. His Plavix was discontinued and he was started on Brilinta. An echocardiogram showed preserved left ventricular function. He made a quick recovery after extubation and was discharged home without any supportive measures. His Brilinta was changed to Effient secondary to dyspnea and this improved. Carotid artery dopplers August 2015 with stable 60-79% RICA stenosis, occluded LICA. 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 ABI stable August 2015. Stress myoview April 2015 with no ischemia.    He is here today for follow up. He has no chest pain. No pain in his legs. He has been active. No near syncope or syncope. Also c/o back pain. Describes morning fatigue.   Primary Care Physician: Cleta Alberts, MD [facility] [place])  Last Lipid Profile:    Past Medical History  Diagnosis Date  . Coronary artery disease     Cardiac cath March 2013 with DES placed Circumflex and DES x 2  in the LAD  . Hypertension   . AAA (abdominal aortic aneurysm)     Aortic stent graft  . Carotid artery disease     s/p right CEA and right carotid (stent 11/09). Carotid dopplers 06/27/11 with RICA patent with 143cm/sec velocity, LICA known occlusion  . PAD (peripheral artery disease)     s/p abdominal aortic endograft with left to right fem-pop bypass. S/p  stenting left external iliac artery 12/17/08. CTA 11/12 with occlusion right limb of graft with patent left to righ fem-fem graft. Mild SFA disease bilaterally.     Past Surgical History  Procedure Laterality Date  . Coronary angioplasty    . Carotid endarterectomy      Right  . Aortic stent graft    . Left heart catheterization with coronary angiogram N/A 01/15/2012    Procedure: LEFT HEART CATHETERIZATION WITH CORONARY ANGIOGRAM;  Surgeon: Kathleene Hazel, MD;  Location: [facility] CATH LAB;  Service: Cardiovascular;  Laterality: N/A;    Current Outpatient Prescriptions  Medication Sig Dispense  Refill  . allopurinol (ZYLOPRIM) 300 MG tablet Take 300 mg by mouth daily.    Marland Kitchen. amitriptyline (ELAVIL) 25 MG tablet Take 25 mg by mouth at bedtime.    Marland Kitchen. amLODipine (NORVASC) 5 MG tablet Take 1 tablet (5 mg total) by mouth daily. 30 tablet 11  . aspirin 81 MG tablet Take 1 tablet (81 mg total) by mouth daily.    Marland Kitchen. atorvastatin (LIPITOR) 40 MG tablet Take 1 tablet (40 mg total) by mouth daily. 30 tablet 11  . carvedilol (COREG) 12.5 MG tablet Take 12.5 mg by mouth 2 (two) times daily with a meal.    . docusate sodium (COLACE) 100 MG capsule Take 200  mg by mouth daily.    . fish oil-omega-3 fatty acids 1000 MG capsule Take 1 g by mouth 2 (two) times daily.    . hydrALAZINE (APRESOLINE) 25 MG tablet Take 25 mg by mouth 2 (two) times daily.    Marland Kitchen. HYDROcodone-acetaminophen (NORCO) 10-325 MG per tablet Take 1 tablet by mouth every 6 (six) hours as needed for moderate pain. \    . levothyroxine (SYNTHROID, LEVOTHROID) 150 MCG tablet Take 150 mcg by mouth daily before breakfast.    . Methylcellulose, Laxative, (CITRUCEL) 500 MG TABS Take 500 mg by mouth daily.     . Multiple Vitamin (MULTIVITAMIN) tablet Take 1 tablet by mouth daily.    . nitroGLYCERIN (NITROSTAT) 0.4 MG SL tablet Place 0.4 mg under the tongue every 5 (five) minutes as needed for chest pain (MAX 3 TABLETS).     . pantoprazole (PROTONIX) 40 MG tablet Take 40 mg by mouth 2 (two) times daily.    Marland Kitchen. PARoxetine (PAXIL) 20 MG tablet Take 10 mg by mouth daily.     . prasugrel (EFFIENT) 10 MG TABS Take 1 tablet (10 mg total) by mouth daily. 30 tablet 11   No current facility-administered medications for this visit.    Allergies  Allergen Reactions  . Codeine Itching    History   Social History  . Marital Status: Single    Spouse Name: N/A  . Number of Children: 1  . Years of Education: N/A   Occupational History  .  [employer]   Social History Main Topics  . Smoking status: Former Smoker -- 1.00 packs/day for 50 years    Types: Cigarettes    Quit date: 01/14/2012  . Smokeless tobacco: Not on file     Comment: Has been  trying to quit  . Alcohol Use: No  . Drug Use: No  . Sexual Activity: Yes   Other Topics Concern  . Not on file   Social History Narrative    Family History  Problem Relation Age of Onset  . Cancer Mother 5355    breast/lung  . Lymphoma Father 1384    Review of Systems:  As stated in the HPI and otherwise negative.   BP 120/74 mmHg  Pulse 61  Ht 5\' 7"  (1.702 m)  Wt 187 lb 9.6 oz (85.095 kg)  BMI 29.38 kg/m2  Physical  Examination: General: Well developed, well nourished, NAD HEENT: OP clear, mucus membranes moist SKIN: warm, dry. No rashes. Neuro: No focal deficits Musculoskeletal: Muscle strength 5/5 all ext Psychiatric: Mood and affect normal Neck: No JVD, right carotid bruit, no thyromegaly, no lymphadenopathy. Lungs:Clear bilaterally, no wheezes, rhonci, crackles Cardiovascular: Regular rate and rhythm. No murmurs, gallops or rubs. Abdomen:Soft. Bowel sounds present. Non-tender.  Extremities: No lower extremity edema. Pulses are 1 + in  the bilateral DP/PT.  Stress myoview April 2015: Rest Procedure: Myocardial perfusion imaging was performed at rest 45 minutes following the intravenous administration of Technetium 428m Sestamibi.  Rest ECG: SB 55 bpm LVH Anteroseptal MI ST T wave chnages consistent with strain pattern.  Stress Procedure: The patient received IV Lexiscan 0.4 mg over 15-seconds. Technetium 438m Sestamibi injected at 30-seconds. This patient had sob, chest tightness, and a headache with the Lexiscan injection. Quantitative spect images were obtained after a 45 minute delay.  Stress ZOX:[id number]:Nondiagnostic due to baseline changes  QPS  Raw Data Images: Soft tissue (diaphragm, bowel acitvity) underlie heart.  Stress Images: Defect in the mid/distal anteroseptal, distal anterior, apical walls.  Rest Images: Comparison with the stress images reveals no significant change.  Subtraction (SDS): No evidence of ischemia.  Transient Ischemic Dilatation (Normal <1.22): 1.04  Lung/Heart Ratio (Normal <0.45): 0.30  Quantitative Gated Spect Images  QGS EDV: 145 ml  QGS ESV: 77 ml  Impression  Exercise Capacity: Lexiscan with no exercise.  BP Response: Normal blood pressure response.  Clinical Symptoms: Mild chest pain/dyspnea.  ECG Impression: Nondiagnostic due to baseline changes.  Comparison with Prior Nuclear Study: No prior study to compare.  Overall Impression: Scar in the mid/distal  anteroseptal, distal anteirior and apical walls. No ischemia.  LV Ejection Fraction: 47% LV Wall Motion: Septal and apical hypokinesis.   EKG:  EKG is not ordered today. The ekg ordered today demonstrates   Recent Labs: 09/13/2014: BUN 15; Creatinine 1.6*; Potassium 3.8; Sodium 137   Lipid Panel No results found for: CHOL, TRIG, HDL, CHOLHDL, VLDL, LDLCALC, LDLDIRECT   Wt Readings from Last 3 Encounters:  03/11/15 187 lb 9.6 oz (85.095 kg)  08/11/14 187 lb (84.823 kg)  02/08/14 186 lb (84.369 kg)     Other studies Reviewed: Additional studies/ records that were reviewed today include: . Review of the above records demonstrates:    Assessment and Plan:   1. CAD: Stable. He is on ASA and Effient and will continue given stent thrombosis, severe PAD and carotid artery disease.  Will continue Coreg and statin. Lipids followed in research (ACCELERATE) but that trial is over. Will need lipids checked. He wishes to do this in primary care and have results sent to us. Ace-inh stopped secondary to renal insufficiency. Stress myoview April 2015 without ischemia.   2. PAD: Stable by dopplers ABI  August 2015, Left 0.8, right 0.84 No leg pain.  Repeat ABI August 2016.  3. HTN: BP controlled.   4. Carotid artery disease: He is known to have occluded LICA and has had right CEA followed by RICA stent placement. Last dopplers February 2016 with stable disease.  Repeat August 2016.   Current medicines are reviewed at length with the patient today.  The patient does not have concerns regarding medicines.  The following changes have been made:  no change  Labs/ tests ordered today include:  No orders of the defined types were placed in this encounter.    Disposition:   FU with me in 6 months  Signed, Verne Carrowhristopher McAlhany, MD 03/11/2015 2:42 PM    [facility] [address], [place], [place]  [address] Phone: [phone]; Fax: [phone]

## 2015-03-31 ENCOUNTER — Telehealth: Payer: Self-pay | Admitting: Cardiovascular Disease

## 2015-03-31 DIAGNOSIS — E785 Hyperlipidemia, unspecified: Secondary | ICD-10-CM

## 2015-03-31 NOTE — Telephone Encounter (Signed)
New Prob    Pt called in wanting to get labs drawn tomorrow per Dr. Clifton James. No orders in EPIC. Please notify when orders are in.

## 2015-03-31 NOTE — Telephone Encounter (Signed)
Left message on pt's identified voicemail that labs are ordered and he could stop by office tomorrow after 7:30.  Message left that labs were fasting and to call if any questions. Lipid and liver profiles ordered.

## 2015-04-01 ENCOUNTER — Other Ambulatory Visit (INDEPENDENT_AMBULATORY_CARE_PROVIDER_SITE_OTHER): Payer: Medicare HMO | Admitting: *Deleted

## 2015-04-01 DIAGNOSIS — E785 Hyperlipidemia, unspecified: Secondary | ICD-10-CM

## 2015-04-01 LAB — HEPATIC FUNCTION PANEL
ALK PHOS: 69 U/L (ref 39–117)
ALT: 19 U/L (ref 0–53)
AST: 19 U/L (ref 0–37)
Albumin: 4.1 g/dL (ref 3.5–5.2)
BILIRUBIN TOTAL: 0.6 mg/dL (ref 0.2–1.2)
Bilirubin, Direct: 0.1 mg/dL (ref 0.0–0.3)
Total Protein: 7.2 g/dL (ref 6.0–8.3)

## 2015-04-01 LAB — LIPID PANEL
CHOL/HDL RATIO: 5
CHOLESTEROL: 134 mg/dL (ref 0–200)
HDL: 26.1 mg/dL — ABNORMAL LOW (ref >39.00)
NonHDL: 107.9
Triglycerides: 225 mg/dL — ABNORMAL HIGH (ref 0.0–149.0)
VLDL: 45 mg/dL — AB (ref 0.0–40.0)

## 2015-04-01 LAB — LDL CHOLESTEROL, DIRECT: Direct LDL: 73 mg/dL

## 2015-04-01 NOTE — Addendum Note (Signed)
Addended by: Tonita [place] on: 04/01/2015 10:01 AM   Modules accepted: Orders

## 2015-04-18 ENCOUNTER — Other Ambulatory Visit: Payer: Self-pay

## 2015-06-01 ENCOUNTER — Encounter (HOSPITAL_COMMUNITY): Payer: Medicare HMO

## 2015-06-01 ENCOUNTER — Other Ambulatory Visit: Payer: Self-pay | Admitting: Cardiovascular Disease

## 2015-06-01 ENCOUNTER — Ambulatory Visit (HOSPITAL_BASED_OUTPATIENT_CLINIC_OR_DEPARTMENT_OTHER)
Admission: RE | Admit: 2015-06-01 | Discharge: 2015-06-01 | Disposition: A | Discharge status: Home / Self Care | Payer: Medicare HMO | Source: Ambulatory Visit | Attending: Cardiovascular Disease | Admitting: Cardiovascular Disease

## 2015-06-01 ENCOUNTER — Ambulatory Visit (HOSPITAL_COMMUNITY)
Admission: RE | Admit: 2015-06-01 | Discharge: 2015-06-01 | Disposition: A | Discharge status: Home / Self Care | Payer: Medicare HMO | Source: Ambulatory Visit | Attending: Cardiovascular Disease | Admitting: Cardiovascular Disease

## 2015-06-01 DIAGNOSIS — Z87891 Personal history of nicotine dependence: Secondary | ICD-10-CM | POA: Diagnosis not present

## 2015-06-01 DIAGNOSIS — E785 Hyperlipidemia, unspecified: Secondary | ICD-10-CM | POA: Insufficient documentation

## 2015-06-01 DIAGNOSIS — I739 Peripheral vascular disease, unspecified: Secondary | ICD-10-CM | POA: Insufficient documentation

## 2015-06-01 DIAGNOSIS — I2510 Atherosclerotic heart disease of native coronary artery without angina pectoris: Secondary | ICD-10-CM | POA: Diagnosis not present

## 2015-06-01 DIAGNOSIS — I779 Disorder of arteries and arterioles, unspecified: Secondary | ICD-10-CM | POA: Diagnosis not present

## 2015-06-01 DIAGNOSIS — Z95828 Presence of other vascular implants and grafts: Secondary | ICD-10-CM | POA: Insufficient documentation

## 2015-06-01 DIAGNOSIS — I6523 Occlusion and stenosis of bilateral carotid arteries: Secondary | ICD-10-CM | POA: Insufficient documentation

## 2015-11-03 ENCOUNTER — Ambulatory Visit: Payer: Medicare HMO | Admitting: Cardiovascular Disease

## 2015-12-13 ENCOUNTER — Encounter: Payer: Self-pay | Admitting: Cardiovascular Disease

## 2015-12-13 ENCOUNTER — Ambulatory Visit (INDEPENDENT_AMBULATORY_CARE_PROVIDER_SITE_OTHER): Payer: Medicare HMO | Admitting: Cardiovascular Disease

## 2015-12-13 VITALS — BP 102/62 | HR 64 | Ht 67.0 in | Wt 184.0 lb

## 2015-12-13 DIAGNOSIS — I2510 Atherosclerotic heart disease of native coronary artery without angina pectoris: Secondary | ICD-10-CM

## 2015-12-13 DIAGNOSIS — I779 Disorder of arteries and arterioles, unspecified: Secondary | ICD-10-CM | POA: Diagnosis not present

## 2015-12-13 DIAGNOSIS — I10 Essential (primary) hypertension: Secondary | ICD-10-CM

## 2015-12-13 DIAGNOSIS — I739 Peripheral vascular disease, unspecified: Secondary | ICD-10-CM

## 2015-12-13 NOTE — Patient Instructions (Signed)
Medication Instructions:  Your physician recommends that you continue on your current medications as directed. Please refer to the Current Medication list given to you today.   Labwork: none  Testing/Procedures: Your physician has requested that you have a carotid duplex. This test is an ultrasound of the carotid arteries in your neck. It looks at blood flow through these arteries that supply the brain with blood. Allow one hour for this exam. There are no restrictions or special instructions. To be done in August 2017  Your physician has requested that you have a lower or upper extremity arterial duplex. This test is an ultrasound of the arteries in the legs or arms. It looks at arterial blood flow in the legs and arms. Allow one hour for Lower and Upper Arterial scans. There are no restrictions or special instructions. To be done in August 2017  Follow-Up: Your physician wants you to follow-up in: 6 months.  You will receive a reminder letter in the mail two months in advance. If you don't receive a letter, please call our office to schedule the follow-up appointment.   Any Other Special Instructions Will Be Listed Below (If Applicable).     If you need a refill on your cardiac medications before your next appointment, please call your pharmacy.

## 2015-12-13 NOTE — Progress Notes (Signed)
Chief Complaint  Patient presents with  . Follow-up     History of Present Illness: 70 yo white male with a history of coronary artery disease, former tobacco abuse, PAD here today for cardiac follow up. Cardiac cath March 2013 with severe disease in the LAD and Circumflex, now s/p DES x 1 Circumflex and DES x 2 LAD. He had chest pain and was admitted to [facility] on 01/14/12. His cardiac enzymes were negative. He was taken to the cath lab on 01/15/2012. He had a 3.5 x 12 mm Promus Element DES in the proximal Circumflex and a 3.0 x 16 mm Promus Element DES was deployed in the mid-LAD. He tolerated the procedure well. However, post cath he had onset of chest pain. His ECG showed ST elevation in the precordial leads and lateral depression. An emergency re-catheterization was performed and showed that the LAD was occluded proximal to the stent, likely from dissection of the vessel because of edge tear on front end of stent. I was able to open the vessel and placed a 3.0 x 20 mm Promus Element DES in the proximal LAD, proximal to the first stent in an overlapping fashion. His Plavix was discontinued and he was started on Brilinta. An echocardiogram showed preserved left ventricular function. He made a quick recovery after extubation and was discharged home without any supportive measures. His Brilinta was changed to Effient secondary to dyspnea and this improved. Carotid artery dopplers August 2016 with stable 60-79% RICA stenosis, occluded LICA. 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 ABI stable August 2016. Stress myoview April 2015 with no ischemia. Norvasc stopped due to hypotension in [place] clinic. He has extensive PAD with prior distal aortic stent graft, left to right fem fem bypass.  He is here today for follow up. He has no chest pain. He has left thigh pain with long walks. No rest pain or ulcerations. No near syncope or syncope. Also c/o  back pain.   Primary Care Physician: Cleta Alberts, MD [facility] [place])  Last Lipid Profile: followed at the [place].    Past Medical History  Diagnosis Date  . Coronary artery disease     Cardiac cath March 2013 with DES placed Circumflex and DES x 2  in the LAD  . Hypertension   . AAA (abdominal aortic aneurysm) (HCC)     Aortic stent graft  . Carotid artery disease (HCC)     s/p right CEA and right carotid (stent 11/09). Carotid dopplers 06/27/11 with RICA patent with 143cm/sec velocity, LICA known occlusion  . PAD (peripheral artery disease) (HCC)     s/p abdominal aortic endograft with left to right fem-pop bypass. S/p  stenting left external iliac artery 12/17/08. CTA 11/12 with occlusion right limb of graft with patent left to righ fem-fem graft. Mild SFA disease bilaterally.     Past Surgical History  Procedure Laterality Date  . Coronary angioplasty    . Carotid endarterectomy      Right  . Aortic stent graft    . Left heart catheterization with coronary angiogram N/A 01/15/2012    Procedure: LEFT HEART CATHETERIZATION WITH CORONARY ANGIOGRAM;  Surgeon: Kathleene Hazel, MD;  Location: [facility] CATH LAB;  Service: Cardiovascular;  Laterality: N/A;    Current Outpatient Prescriptions  Medication Sig Dispense Refill  . allopurinol (ZYLOPRIM) 300 MG tablet Take 300 mg by mouth daily.    Marland Kitchen amitriptyline (ELAVIL)  25 MG tablet Take 25 mg by mouth at bedtime.    Marland Kitchen aspirin 81 MG tablet Take 1 tablet (81 mg total) by mouth daily.    Marland Kitchen atorvastatin (LIPITOR) 40 MG tablet Take 1 tablet (40 mg total) by mouth daily. 30 tablet 11  . carvedilol (COREG) 12.5 MG tablet Take 12.5 mg by mouth 2 (two) times daily with a meal.    . docusate sodium (COLACE) 100 MG capsule Take 200 mg by mouth daily.    . fish oil-omega-3 fatty acids 1000 MG capsule Take 1 g by mouth 2 (two) times daily.    Marland Kitchen gabapentin (NEURONTIN) 100 MG capsule Take 100 mg by mouth daily.    . hydrALAZINE (APRESOLINE) 25 MG  tablet Take 25 mg by mouth 2 (two) times daily.    Marland Kitchen HYDROcodone-acetaminophen (NORCO) 10-325 MG per tablet Take 1 tablet by mouth every 6 (six) hours as needed for moderate pain. \    . levothyroxine (SYNTHROID, LEVOTHROID) 150 MCG tablet Take 150 mcg by mouth daily before breakfast.    . Methylcellulose, Laxative, (CITRUCEL) 500 MG TABS Take 500 mg by mouth daily.     . Multiple Vitamin (MULTIVITAMIN) tablet Take 1 tablet by mouth daily.    . nitroGLYCERIN (NITROSTAT) 0.4 MG SL tablet Place 0.4 mg under the tongue every 5 (five) minutes as needed for chest pain (MAX 3 TABLETS).     . pantoprazole (PROTONIX) 40 MG tablet Take 40 mg by mouth 2 (two) times daily.    Marland Kitchen PARoxetine (PAXIL) 20 MG tablet Take 10 mg by mouth daily.     . prasugrel (EFFIENT) 10 MG TABS Take 1 tablet (10 mg total) by mouth daily. 30 tablet 11   No current facility-administered medications for this visit.    Allergies  Allergen Reactions  . Codeine Itching    Social History   Social History  . Marital Status: Single    Spouse Name: N/A  . Number of Children: 1  . Years of Education: N/A   Occupational History  .  [employer]   Social History Main Topics  . Smoking status: Former Smoker -- 1.00 packs/day for 50 years    Types: Cigarettes    Quit date: 01/14/2012  . Smokeless tobacco: Not on file  . Alcohol Use: No  . Drug Use: No  . Sexual Activity: Yes   Other Topics Concern  . Not on file   Social History Narrative    Family History  Problem Relation Age of Onset  . Cancer Mother 2    breast/lung  . Lymphoma Father 47    Review of Systems:  As stated in the HPI and otherwise negative.   BP 102/62 mmHg  Pulse 64  Ht  (1.702 m)  Wt 184 lb (83.462 kg)  BMI 28.81 kg/m2  Physical Examination: General: Well developed, well nourished, NAD HEENT: OP clear, mucus membranes moist SKIN: warm, dry. No rashes. Neuro: No focal deficits Musculoskeletal: Muscle strength 5/5 all  ext Psychiatric: Mood and affect normal Neck: No JVD, right carotid bruit, no thyromegaly, no lymphadenopathy. Lungs:Clear bilaterally, no wheezes, rhonci, crackles Cardiovascular: Regular rate and rhythm. No murmurs, gallops or rubs. Abdomen:Soft. Bowel sounds present. Non-tender.  Extremities: No lower extremity edema. Pulses are 1 + in the bilateral DP/PT.  Stress myoview April 2015: Rest Procedure: Myocardial perfusion imaging was performed at rest 45 minutes following the intravenous administration of Technetium 33m Sestamibi.  Rest ECG: SB 55 bpm LVH Anteroseptal MI  ST T wave chnages consistent with strain pattern.  Stress Procedure: The patient received IV Lexiscan 0.4 mg over 15-seconds. Technetium 38m Sestamibi injected at 30-seconds. This patient had sob, chest tightness, and a headache with the Lexiscan injection. Quantitative spect images were obtained after a 45 minute delay.  Stress WUJ:[id number] due to baseline changes  QPS  Raw Data Images: Soft tissue (diaphragm, bowel acitvity) underlie heart.  Stress Images: Defect in the mid/distal anteroseptal, distal anterior, apical walls.  Rest Images: Comparison with the stress images reveals no significant change.  Subtraction (SDS): No evidence of ischemia.  Transient Ischemic Dilatation (Normal <1.22): 1.04  Lung/Heart Ratio (Normal <0.45): 0.30  Quantitative Gated Spect Images  QGS EDV: 145 ml  QGS ESV: 77 ml  Impression  Exercise Capacity: Lexiscan with no exercise.  BP Response: Normal blood pressure response.  Clinical Symptoms: Mild chest pain/dyspnea.  ECG Impression: Nondiagnostic due to baseline changes.  Comparison with Prior Nuclear Study: No prior study to compare.  Overall Impression: Scar in the mid/distal anteroseptal, distal anteirior and apical walls. No ischemia.  LV Ejection Fraction: 47% LV Wall Motion: Septal and apical hypokinesis.   EKG:  EKG is ordered today. The ekg ordered today demonstrates  NSR, rate 60 bpm. LVH. Prior septal infarct.   Recent Labs: 04/01/2015: ALT 19   Lipid Panel    Component Value Date/Time   CHOL 134 04/01/2015 1002   TRIG 225.0* 04/01/2015 1002   HDL 26.10* 04/01/2015 1002   CHOLHDL 5 04/01/2015 1002   VLDL 45.0* 04/01/2015 1002   LDLDIRECT 73.0 04/01/2015 1002     Wt Readings from Last 3 Encounters:  12/13/15 184 lb (83.462 kg)  03/11/15 187 lb 9.6 oz (85.095 kg)  08/11/14 187 lb (84.823 kg)     Other studies Reviewed: Additional studies/ records that were reviewed today include: . Review of the above records demonstrates:    Assessment and Plan:   1. CAD: Stable. He is on ASA and Effient and will continue given stent thrombosis, severe PAD and carotid artery disease.  Will continue Coreg and statin. Lipids ok in 2016. They are usually followed in the [place]. I have reviewed his EKG today and it is unchanged. Ace-inh stopped secondary to renal insufficiency. Stress myoview April 2015 without ischemia.   2. PAD: Stable by dopplers ABI  August 2016. He has some pain in his left thigh with long walking. Dopplers with at least 50% stenosis in the left iliac system (? Prior stent in the iliac artery). Repeat ABI August 2017. If he has worsened pain in left leg, will call and we can get him in to [facility] clinic.   3. HTN: BP controlled. It has been on the low side. Norvasc stopped recently. If necessary, we could reduce his Coreg to 6.25 mg po BID. No changes today.   4. Carotid artery disease: He is known to have occluded LICA and has had right CEA followed by RICA stent placement. Last dopplers August 2016 with stable disease.  Repeat August 2017.   Current medicines are reviewed at length with the patient today.  The patient does not have concerns regarding medicines.  The following changes have been made:  no change  Labs/ tests ordered today include:   Orders Placed This Encounter  Procedures  . EKG 12-Lead    Disposition:   FU with me in 6  months  Signed, Verne Carrow, MD 12/13/2015 11:52 AM    [facility] [address]  [address], [place], [place]  [address] Phone: [phone]; Fax: [phone]

## 2016-05-21 ENCOUNTER — Other Ambulatory Visit: Payer: Self-pay | Admitting: Cardiovascular Disease

## 2016-05-21 DIAGNOSIS — I739 Peripheral vascular disease, unspecified: Secondary | ICD-10-CM

## 2016-05-29 ENCOUNTER — Ambulatory Visit (HOSPITAL_COMMUNITY)
Admission: RE | Admit: 2016-05-29 | Discharge: 2016-05-29 | Disposition: A | Discharge status: Home / Self Care | Payer: Medicare HMO | Source: Ambulatory Visit | Attending: Cardiovascular Disease | Admitting: Cardiovascular Disease

## 2016-05-29 ENCOUNTER — Inpatient Hospital Stay (HOSPITAL_COMMUNITY): Admission: RE | Admit: 2016-05-29 | Payer: Medicare HMO | Source: Ambulatory Visit

## 2016-05-29 DIAGNOSIS — I739 Peripheral vascular disease, unspecified: Secondary | ICD-10-CM | POA: Insufficient documentation

## 2016-05-29 DIAGNOSIS — G458 Other transient cerebral ischemic attacks and related syndromes: Secondary | ICD-10-CM | POA: Diagnosis not present

## 2016-05-29 DIAGNOSIS — R938 Abnormal findings on diagnostic imaging of other specified body structures: Secondary | ICD-10-CM | POA: Diagnosis not present

## 2016-05-29 DIAGNOSIS — I10 Essential (primary) hypertension: Secondary | ICD-10-CM | POA: Diagnosis not present

## 2016-05-29 DIAGNOSIS — I2510 Atherosclerotic heart disease of native coronary artery without angina pectoris: Secondary | ICD-10-CM | POA: Diagnosis not present

## 2016-05-29 DIAGNOSIS — I6523 Occlusion and stenosis of bilateral carotid arteries: Secondary | ICD-10-CM | POA: Insufficient documentation

## 2016-05-29 DIAGNOSIS — I779 Disorder of arteries and arterioles, unspecified: Secondary | ICD-10-CM | POA: Insufficient documentation

## 2016-07-06 ENCOUNTER — Ambulatory Visit: Payer: Medicare HMO | Admitting: Cardiovascular Disease

## 2016-11-01 ENCOUNTER — Telehealth: Payer: Self-pay | Admitting: Cardiovascular Disease

## 2016-11-01 NOTE — Telephone Encounter (Signed)
New Message:    Please call,his blood pressure is up.

## 2016-11-01 NOTE — Telephone Encounter (Signed)
Pt is doing fine. Pt was calling about his son about  having high BP. Pt's son was seen in the ER with BP 227/120. BP medications were given in the ER. Pt's son has  an appointment with Dr. Clifton JamesMcalhany on 11/19/16.

## 2016-12-07 ENCOUNTER — Telehealth: Payer: Self-pay | Admitting: *Deleted

## 2016-12-07 ENCOUNTER — Encounter: Payer: Self-pay | Admitting: Cardiovascular Disease

## 2016-12-07 NOTE — Telephone Encounter (Signed)
Request to stop Effient 5-7 days prior to EGD received in office from [facility].  I spoke with pt and he has had no cardiac procedure or problems since last seen here.  He is establishing soon with new cardiac practice [place] is now living.  I told pt Dr. Clifton JamesMcAlhany would complete form indicating it was OK to hold Effient prior to procedure.

## 2017-08-12 ENCOUNTER — Other Ambulatory Visit: Payer: Self-pay | Admitting: *Deleted

## 2017-08-12 DIAGNOSIS — I6529 Occlusion and stenosis of unspecified carotid artery: Secondary | ICD-10-CM

## 2017-08-12 DIAGNOSIS — I739 Peripheral vascular disease, unspecified: Secondary | ICD-10-CM

## 2019-03-23 DEATH — deceased
